# Patient Record
Sex: Female | Born: 1970 | Race: White | Hispanic: No | Marital: Married | Smoking: Former smoker
Health system: Southern US, Community
[De-identification: ages and names within clinical notes are randomized; demographics above are authoritative.]

## PROBLEM LIST (undated history)

## (undated) DIAGNOSIS — I82409 Acute embolism and thrombosis of unspecified deep veins of unspecified lower extremity: Secondary | ICD-10-CM

## (undated) DIAGNOSIS — R87619 Unspecified abnormal cytological findings in specimens from cervix uteri: Secondary | ICD-10-CM

## (undated) DIAGNOSIS — R0602 Shortness of breath: Secondary | ICD-10-CM

## (undated) DIAGNOSIS — E785 Hyperlipidemia, unspecified: Secondary | ICD-10-CM

## (undated) DIAGNOSIS — R000 Tachycardia, unspecified: Secondary | ICD-10-CM

## (undated) HISTORY — PX: CERVICAL BIOPSY  W/ LOOP ELECTRODE EXCISION: SUR135

## (undated) HISTORY — DX: Unspecified abnormal cytological findings in specimens from cervix uteri: R87.619

## (undated) HISTORY — DX: Acute embolism and thrombosis of unspecified deep veins of unspecified lower extremity: I82.409

## (undated) HISTORY — PX: DILATION AND CURETTAGE OF UTERUS: SHX78

## (undated) HISTORY — PX: COLPOSCOPY: SHX161

---

## 2005-12-26 HISTORY — PX: KNEE SURGERY: SHX244

## 2007-12-27 HISTORY — PX: BREAST BIOPSY: SHX20

## 2011-07-26 ENCOUNTER — Ambulatory Visit: Payer: Self-pay | Admitting: Unknown Physician Specialty

## 2012-07-30 ENCOUNTER — Ambulatory Visit: Payer: Self-pay | Admitting: Unknown Physician Specialty

## 2013-08-02 ENCOUNTER — Ambulatory Visit: Payer: Self-pay | Admitting: Unknown Physician Specialty

## 2014-05-29 ENCOUNTER — Ambulatory Visit (INDEPENDENT_AMBULATORY_CARE_PROVIDER_SITE_OTHER): Payer: Managed Care, Other (non HMO) | Admitting: General Surgery

## 2014-05-29 ENCOUNTER — Encounter: Payer: Self-pay | Admitting: General Surgery

## 2014-05-29 VITALS — BP 112/68 | HR 72 | Resp 12 | Ht 71.0 in | Wt 159.0 lb

## 2014-05-29 DIAGNOSIS — K625 Hemorrhage of anus and rectum: Secondary | ICD-10-CM

## 2014-05-29 LAB — POC HEMOCCULT BLD/STL (OFFICE/1-CARD/DIAGNOSTIC): Fecal Occult Blood, POC: NEGATIVE

## 2014-05-29 NOTE — Progress Notes (Signed)
Patient ID: Cheyenne Rodriguez, female   DOB: [birth year], 43 y.o.   MRN: [id number]  Chief Complaint  Patient presents with  . Other    hemorrhoids    HPI Cheyenne Rodriguez is a 43 y.o. female here today for an evaluation of hemorrhoids. Patient states they have been there for about 2 months. Her symptoms were severe for about a month, and that she does gradual improvement. Patient states they have not been bleeding in over a month. She states there has been sharp, stabbing and burning pain with the present flare up. She states she she moves her bowels once a day. . The patient was last seen here in 2010 regarding a breast tissue. This has not recurred. HPI  No past medical history on file.  Past Surgical History  Procedure Laterality Date  . Knee surgery  2007    Family History  Problem Relation Age of Onset  . Colon polyps Father 79  . Ovarian cancer Paternal Grandmother     Social History History  Substance Use Topics  . Smoking status: Former Smoker -- 1.00 packs/day for 2 years    Types: Cigarettes  . Smokeless tobacco: Never Used  . Alcohol Use: Yes    No Known Allergies  Current Outpatient Prescriptions  Medication Sig Dispense Refill  . [facility] 1/35 1-35 MG-MCG tablet Take 1 tablet by mouth daily.       No current facility-administered medications for this visit.    Review of Systems Review of Systems  Constitutional: Negative.   Respiratory: Negative.   Cardiovascular: Negative.   Gastrointestinal: Positive for anal bleeding.    Blood pressure 112/68, pulse 72, resp. rate 12, height 5\' 11"  (1.803 m), weight 159 lb (72.122 kg), last menstrual period 04/02/2014.  Physical Exam Physical Exam  Constitutional: She is oriented to person, place, and time. She appears well-developed and well-nourished.  Eyes: Conjunctivae are normal. No scleral icterus.  Neck: Neck supple.  Cardiovascular: Normal rate, regular rhythm and normal heart sounds.   Pulmonary/Chest: Breath  sounds normal.  Abdominal: Bowel sounds are normal.  Neurological: She is alert and oriented to person, place, and time.  Skin: Skin is warm and dry.    Data Reviewed Anoscopy was completed showing normal visualize lower rectal mucosa. Mild prominence of the internal hemorrhoid area without active bleeding or any lesions amenable to banding. No prolapsing mucosa with straining. Normal sphincter tone.  Assessment    Rectal bleeding likely from ano- rectal source, resolved.     Plan    At this time her family history of colon polyps in her father his 82s does not warrant early colonoscopy. She has been encouraged to continue the use of Citrucel, 1 tablespoon daily which he had initiated on her own over a month ago with good results. Should she have recurrent bleeding she was encouraged to call promptly for reassessment.     PCP: Eliberto Ivory Christyne Mccain 05/30/2014, 9:05 PM

## 2014-05-29 NOTE — Patient Instructions (Signed)
Patient to return as needed. 

## 2014-05-30 DIAGNOSIS — K625 Hemorrhage of anus and rectum: Secondary | ICD-10-CM | POA: Insufficient documentation

## 2014-08-14 ENCOUNTER — Ambulatory Visit: Payer: Self-pay | Admitting: Unknown Physician Specialty

## 2014-10-27 ENCOUNTER — Encounter: Payer: Self-pay | Admitting: General Surgery

## 2015-11-19 ENCOUNTER — Encounter: Payer: Self-pay | Admitting: Emergency Medicine

## 2015-11-19 ENCOUNTER — Emergency Department: Payer: Managed Care, Other (non HMO)

## 2015-11-19 ENCOUNTER — Emergency Department
Admission: EM | Admit: 2015-11-19 | Discharge: 2015-11-19 | Disposition: A | Discharge status: Home / Self Care | Payer: Managed Care, Other (non HMO) | Attending: Emergency Medicine | Admitting: Emergency Medicine

## 2015-11-19 DIAGNOSIS — Y9389 Activity, other specified: Secondary | ICD-10-CM | POA: Diagnosis not present

## 2015-11-19 DIAGNOSIS — Z79899 Other long term (current) drug therapy: Secondary | ICD-10-CM | POA: Diagnosis not present

## 2015-11-19 DIAGNOSIS — Y92009 Unspecified place in unspecified non-institutional (private) residence as the place of occurrence of the external cause: Secondary | ICD-10-CM | POA: Insufficient documentation

## 2015-11-19 DIAGNOSIS — S62002A Unspecified fracture of navicular [scaphoid] bone of left wrist, initial encounter for closed fracture: Secondary | ICD-10-CM | POA: Diagnosis not present

## 2015-11-19 DIAGNOSIS — X58XXXA Exposure to other specified factors, initial encounter: Secondary | ICD-10-CM | POA: Insufficient documentation

## 2015-11-19 DIAGNOSIS — M779 Enthesopathy, unspecified: Secondary | ICD-10-CM | POA: Insufficient documentation

## 2015-11-19 DIAGNOSIS — Z87891 Personal history of nicotine dependence: Secondary | ICD-10-CM | POA: Diagnosis not present

## 2015-11-19 DIAGNOSIS — Y998 Other external cause status: Secondary | ICD-10-CM | POA: Diagnosis not present

## 2015-11-19 DIAGNOSIS — M778 Other enthesopathies, not elsewhere classified: Secondary | ICD-10-CM

## 2015-11-19 DIAGNOSIS — S63502A Unspecified sprain of left wrist, initial encounter: Secondary | ICD-10-CM | POA: Diagnosis not present

## 2015-11-19 DIAGNOSIS — S6992XA Unspecified injury of left wrist, hand and finger(s), initial encounter: Secondary | ICD-10-CM | POA: Diagnosis present

## 2015-11-19 NOTE — ED Provider Notes (Signed)
[facility] Emergency Department Provider Note ____________________________________________  Time seen: 0825  I have reviewed the triage vital signs and the nursing notes.  HISTORY  Chief Complaint  Wrist Pain  HPI Cheyenne Rodriguez is a 44 y.o. female, right hand dominant, reports to the ED with left wrist pain following an an injury at home. She recalls that yesterday evening she pushed a large kitchen [place] across the floor, that did not have Castor's or rollers on it. She recalls is the only possible mechanism of injury for her wrist pain. She denies any other fall on outstretched hand, rash, or accident. She notes onset of pain about 3 AM this morning to the left wrist at the thumb. She denies any distal paresthesias, swelling, or grip changes. The pain is intermittently known how she ranges her hand or wrist. She rates her discomfort a 6/10 in triage.  History reviewed. No pertinent past medical history.  Patient Active Problem List   Diagnosis Date Noted  . Rectal bleeding 05/30/2014    Past Surgical History  Procedure Laterality Date  . Knee surgery  2007    Current Outpatient Rx  Name  Route  Sig  Dispense  Refill  . [facility]-MCG [facility]   Oral   Take 1 tablet by mouth daily.           Allergies Review of patient's allergies indicates no known allergies.  Family History  Problem Relation Age of Onset  . Colon polyps Father [id number]  . Ovarian cancer Paternal Grandmother     Social History Social History  Substance Use Topics  . Smoking status: Former Smoker -- 1.00 packs/day for 2 years    Types: Cigarettes  . Smokeless tobacco: Never Used  . Alcohol Use: Yes    Review of Systems  Constitutional: Negative for fever. Eyes: Negative for visual changes. ENT: Negative for sore throat. Cardiovascular: Negative for chest pain. Respiratory: Negative for shortness of breath. Gastrointestinal: Negative for abdominal pain, vomiting  and diarrhea. Genitourinary: Negative for dysuria. Musculoskeletal: Negative for back pain. Left wrist pain as above. Skin: Negative for rash. Neurological: Negative for headaches, focal weakness or numbness. ____________________________________________  PHYSICAL EXAM:  VITAL SIGNS: ED Triage Vitals  Enc Vitals Group     BP 11/19/15 0756 120/81 mmHg     Pulse Rate 11/19/15 0756 82     Resp 11/19/15 0756 20     Temp 11/19/15 0756 98.4 F (36.9 C)     Temp Source 11/19/15 0756 Oral     SpO2 11/19/15 0756 100 %     Weight 11/19/15 0756 160 lb (72.576 kg)     Height 11/19/15 0756 5\' 11"  (1.803 m)     Head Cir --      Peak Flow --      Pain Score 11/19/15 0754 6     Pain Loc --      Pain Edu? --      Excl. in GC? --    Constitutional: Alert and oriented. Well appearing and in no distress. Head: Normocephalic and atraumatic.      Eyes: Conjunctivae are normal. PERRL. Normal extraocular movements      Ears: Canals clear. TMs intact bilaterally.   Nose: No congestion/rhinorrhea.   Mouth/Throat: Mucous membranes are moist.   Neck: Supple. No thyromegaly. Hematological/Lymphatic/Immunological: No cervical lymphadenopathy. Cardiovascular: Normal rate, regular rhythm.  Respiratory: Normal respiratory effort. No wheezes/rales/rhonchi. Gastrointestinal: Soft and nontender. No distention. Musculoskeletal: Left wrist and thumb without  obvious deformity. Patient with normal composite fist on exam. She is with normal wrist range of motion in all planes. She is minimally tender to palpation over the extensor tendons of the thumb. She does have a positive snuffbox finding. She has a mildly positive [place]. Nontender with normal range of motion in all extremities.  Neurologic: Cranial nerves II through XII grossly intact. Normal intrinsic and opposition testing. Normal gait without ataxia. Normal speech and language. No gross focal neurologic deficits are appreciated. Skin:  Skin is  warm, dry and intact. No rash noted. Psychiatric: Mood and affect are normal. Patient exhibits appropriate insight and judgment. ____________________________________________   RADIOLOGY  Left Wrist/Thumb IMPRESSION: No acute abnormality noted  I, Weston Fulco, Charlesetta Ivory, personally viewed and evaluated these images (plain radiographs) as part of my medical decision making.  ____________________________________________  SPLINT APPLICATION Date/Time: 4098 Authorized by: Lissa Hoard Consent: Verbal consent obtained. Risks and benefits: risks, benefits and alternatives were discussed Consent given by: patient Splint applied by: me Location details: left thumb Splint type: Thumb spica  Supplies used: [facility] Post-procedure: The splinted body part was neurovascularly unchanged following the procedure. Patient tolerance: Patient tolerated the procedure well with no immediate complications. ____________________________________________  INITIAL IMPRESSION / ASSESSMENT AND PLAN / ED COURSE  Patient with an acute left thumb tenosynovitis and wrist sprain. She is tender in the snuffbox, so she will be managed appropriately. The mechanism is low-impact, so suspicion of true fracture is low. She was fitted with a thumb spica splint and encouraged to follow up with orthopedics if symptoms persist. She is given RICE instructions for management of her wrist and thumb sprain. ____________________________________________  FINAL CLINICAL IMPRESSION(S) / ED DIAGNOSES  Final diagnoses:  Wrist sprain, left, initial encounter  Tendinitis of thumb  Occult fracture of scaphoid bone of left wrist, closed, initial encounter      Lissa Hoard, PA-C 11/19/15 0944  Lissa Hoard, PA-C 11/19/15 1038  Sharman Cheek, MD 11/19/15 1353

## 2015-11-19 NOTE — Discharge Instructions (Signed)
Wrist Sprain A wrist sprain is a stretch or tear in the strong, fibrous tissues (ligaments) that connect your wrist bones. The ligaments of your wrist may be easily sprained. There are three types of wrist sprains.  Grade 1. The ligament is not stretched or torn, but the sprain causes pain.  Grade 2. The ligament is stretched or partially torn. You may be able to move your wrist, but not very much.  Grade 3. The ligament or muscle completely tears. You may find it difficult or extremely painful to move your wrist even a little. CAUSES Often, wrist sprains are a result of a fall or an injury. The force of the impact causes the fibers of your ligament to stretch too much or tear. Common causes of wrist sprains include:  Overextending your wrist while catching a ball with your hands.  Repetitive or strenuous extension or bending of your wrist.  Landing on your hand during a fall. RISK FACTORS  Having previous wrist injuries.  Playing contact sports, such as boxing or wrestling.  Participating in activities in which falling is common.  Having poor wrist strength and flexibility. SIGNS AND SYMPTOMS  Wrist pain.  Wrist tenderness.  Inflammation or bruising of the wrist area.  Hearing a "pop" or feeling a tear at the time of the injury.  Decreased wrist movement due to pain, stiffness, or weakness. DIAGNOSIS Your health care provider will examine your wrist. In some cases, an X-ray will be taken to make sure you did not break any bones. If your health care provider thinks that you tore a ligament, he or she may order an MRI of your wrist. TREATMENT Treatment involves resting and icing your wrist. You may also need to take pain medicines to help lessen pain and inflammation. Your health care provider may recommend keeping your wrist still (immobilized) with a splint to help your sprain heal. When the splint is no longer necessary, you may need to perform strengthening and stretching  exercises. These exercises help you to regain strength and full range of motion in your wrist. Surgery is not usually needed for wrist sprains unless the ligament completely tears. HOME CARE INSTRUCTIONS  Rest your wrist. Do not do things that cause pain.  Wear your wrist splint as directed by your health care provider.  Take medicines only as directed by your health care provider.  To ease pain and swelling, apply ice to the injured area.  Put ice in a plastic bag.  Place a towel between your skin and the bag.  Leave the ice on for 20 minutes, 2-3 times a day. SEEK MEDICAL CARE IF:  Your pain, discomfort, or swelling gets worse even with treatment.  You feel sudden numbness in your hand.   This information is not intended to replace advice given to you by your health care provider. Make sure you discuss any questions you have with your health care provider.   Document Released: 08/15/2014 Document Reviewed: 08/15/2014 Elsevier Interactive Patient Education 2016 [employer]  Tendinitis and Tenosynovitis  Tendinitis is inflammation of the tendon. Tenosynovitis is inflammation of the lining around the tendon (tendon sheath). These painful conditions often occur at once. Tendons attach muscle to bone. To move a limb, force from the muscle moves through the tendon, to the bone. These conditions often cause increased pain when moving. Tendinitis may be caused by a small or partial tear in the tendon.  SYMPTOMS   Pain, tenderness, redness, bruising, or swelling at the injury.  Loss of normal joint movement.  Pain that gets worse with use of the muscle and joint attached to the tendon.  Weakness in the tendon, caused by calcium build up that may occur with tendinitis.  Commonly affected tendons:  Achilles tendon (calf of leg).  Rotator cuff (shoulder joint).  Patellar tendon (kneecap to shin).  Peroneal tendon (ankle).  Posterior tibial tendon (inner ankle).  Biceps  tendon (in front of shoulder). CAUSES   Sudden strain on a flexed muscle, muscle overuse, sudden increase or change in activity, vigorous activity.  Result of a direct hit (less common).  Poor muscle action (biomechanics). RISK INCREASES WITH:  Injury (trauma).  Too much exercise.  Sudden change in athletic activity.  Incorrect exercise form or technique.  Poor strength and flexibility.  Not warming-up properly before activity.  Returning to activity before healing is complete. PREVENTION   Warm-up and stretch properly before activity.  Maintain physical fitness:  Joint flexibility.  Muscle strength and endurance.  Fitness that increases heart rate.  Learn and use proper exercise techniques.  Use rehabilitation exercises to strengthen weak muscles and tendons.  Ice the tendon after activity, to reduce recurring inflammation.  Wear proper fitting protective equipment for specific tendons, when indicated. PROGNOSIS  When treated properly, can be cured in 6 to 8 weeks. Recovery may take longer, depending on degree of injury.  RELATED COMPLICATIONS   Re-injury or recurring symptoms.  Permanent weakness or joint stiffness, if injury is severe and recovery is not completed.  Delayed healing, if sports are started before healing is complete.  Tearing apart (rupture) of the inflamed tendon. Tendinitis means the tendon is injured and must recover. TREATMENT  Treatment first involves ice, medicine, and rest from aggravating activities. This reduces pain and inflammation. Modifying your activity may be considered to prevent recurring injury. A brace, elastic bandage wrap, splint, cast, or sling may be prescribed to protect the joint for a short period. After that period, strengthening and stretching exercise may help to regain strength and full range of motion. If the condition persists, despite non-surgical treatment, surgery may be recommended to remove the inflamed  tendon lining. Corticosteroid injections may be given to reduce inflammation. However, these injections may weaken the tendon and increase your risk for tendon rupture. MEDICATION   If pain medicine is needed, nonsteroidal anti-inflammatory medicines (aspirin and ibuprofen), or other minor pain relievers (acetaminophen), are often recommended.  Do not take pain medicine for 7 days before surgery.  Prescription pain relievers are usually prescribed only after surgery. Use only as directed and only as much as you need.  Ointments applied to the skin may be helpful.  Corticosteroid injections may be given to reduce inflammation. However, this may increase your risk of a tendon rupture. HEAT AND COLD  Cold treatment (icing) relieves pain and reduces inflammation. Cold treatment should be applied for 10 to 15 minutes every 2 to 3 hours, and immediately after activity that aggravates your symptoms. Use ice packs or an ice massage.  Heat treatment may be used before performing stretching and strengthening activities prescribed by your caregiver, physical therapist, or athletic trainer. Use a heat pack or a warm water soak. SEEK MEDICAL CARE IF:   Symptoms get worse or do not improve, despite treatment.  Pain becomes too much to tolerate.  You develop numbness or tingling.  Toes become cold, or toenails become blue, gray, or dark colored.  New, unexplained symptoms develop. (Drugs used in treatment may produce side effects.)  This information is not intended to replace advice given to you by your health care provider. Make sure you discuss any questions you have with your health care provider.   Document Released: 12/12/2005 Document Revised: 03/05/2012 Document Reviewed: 03/26/2009 Elsevier Interactive Patient Education 2016 [employer]  Scaphoid Fracture, Wrist A fracture is a break in the bone. The bone you have broken often does not show up as a fracture on x-ray until later on in  the healing phase. This bone is called the scaphoid bone. With this bone, your caregiver will often cast or splint your wrist as though it is fractured, even if a fracture is not seen on the x-ray. This is often done with wrist injuries in which there is tenderness at the base of the thumb. An x-ray at 1-3 weeks after your injury may confirm this fracture. A cast or splint is used to protect and keep your injured bone in good position for healing. The cast or splint will be on generally for about 6 to 16 weeks, depending on your health, age, the fracture location and how quickly you heal. Another name for the scaphoid bone is the navicular bone. HOME CARE INSTRUCTIONS   To lessen the swelling and pain, keep the injured part elevated above your heart while sitting or lying down.  Apply ice to the injury for 15-20 minutes, 03-04 times per day while awake, for 2 days. Put the ice in a plastic bag and place a thin towel between the bag of ice and your cast.  If you have a plaster or fiberglass cast or splint:  Do not try to scratch the skin under the cast using sharp or pointed objects.  Check the skin around the cast every day. You may put lotion on any red or sore areas.  Keep your cast or splint dry and clean.  If you have a plaster splint:  Wear the splint as directed.  You may loosen the elastic bandage around the splint if your fingers become numb, tingle, or turn cold or blue.  If you have been put in a removable splint, wear and use as directed.  Do not put pressure on any part of your cast or splint; it may deform or break. Rest your cast or splint only on a pillow the first 24 hours until it is fully hardened.  Your cast or splint can be protected during bathing with a plastic bag. Do not lower the cast or splint into water.  Only take over-the-counter or prescription medicines for pain, discomfort, or fever as directed by your caregiver.  If your caregiver has given you a follow  up appointment, it is very important to keep that appointment. Not keeping the appointment could result in chronic pain and decreased function. If there is any problem keeping the appointment, you must call back to this facility for assistance. SEEK IMMEDIATE MEDICAL CARE IF:   Your cast gets damaged, wet or breaks.  You have continued severe pain or more swelling than you did before the cast or splint was put on.  Your skin or nails below the injury turn blue or gray, or feel cold or numb.  You have tingling or burning pain in your fingers or increasing pain with movement of your fingers   This information is not intended to replace advice given to you by your health care provider. Make sure you discuss any questions you have with your health care provider.   Document Released: 12/02/2002 Document Revised: 03/05/2012  Document Reviewed: 06/24/2015 Elsevier Interactive Patient Education 2016 [employer]   Apply ice through the splint to reduce pain and swelling. Follow-up with ortho as discussed if symptoms persist.

## 2015-11-19 NOTE — ED Notes (Signed)
Pt states she woke up at 0300 in the am with left wrist pain, states last night she pushed her kitchen [place] and she thinks that is what injured wrist

## 2017-05-26 ENCOUNTER — Encounter: Payer: Self-pay | Admitting: Certified Nurse Midwife

## 2017-05-26 ENCOUNTER — Ambulatory Visit (INDEPENDENT_AMBULATORY_CARE_PROVIDER_SITE_OTHER): Payer: 59 | Admitting: Certified Nurse Midwife

## 2017-05-26 VITALS — BP 115/84 | HR 87 | Ht 71.0 in | Wt 155.8 lb

## 2017-05-26 DIAGNOSIS — Z1231 Encounter for screening mammogram for malignant neoplasm of breast: Secondary | ICD-10-CM | POA: Diagnosis not present

## 2017-05-26 DIAGNOSIS — Z01419 Encounter for gynecological examination (general) (routine) without abnormal findings: Secondary | ICD-10-CM

## 2017-05-26 MED ORDER — NORETHIN ACE-ETH ESTRAD-FE 1-20 MG-MCG PO TABS
1.0000 | ORAL_TABLET | Freq: Every day | ORAL | 0 refills | Status: DC
Start: 2017-05-26 — End: 2017-05-26

## 2017-05-26 MED ORDER — NORETHIN ACE-ETH ESTRAD-FE 1-20 MG-MCG PO TABS: 1.0000 | ORAL_TABLET | Freq: Every day | ORAL | 3 refills | Status: DC

## 2017-05-26 NOTE — Progress Notes (Signed)
GYNECOLOGY ANNUAL PREVENTATIVE CARE ENCOUNTER NOTE  Subjective:   Cheyenne Rodriguez is a 46 y.o. [phone] female here for a routine annual gynecologic exam.  Current complaints: none.   Denies abnormal vaginal bleeding, discharge, pelvic pain, problems with intercourse or other gynecologic concerns.    Gynecologic History No LMP recorded. Patient is not currently having periods (Reason: Oral contraceptives). Contraception: OCP (estrogen/progesterone). Junel 1/20 working well.  Last Pap: 2017. Results were: normal per pt . Has history of abnormal pap. Says she gets them annually  Last mammogram: 2015. Results were: has history abnormal/biopsy -WNL  Obstetric History OB History  Gravida Para Term Preterm AB Living  3 2 2   1 2   SAB TAB Ectopic Multiple Live Births  1       2    # Outcome Date GA Lbr Len/2nd Weight Sex Delivery Anes PTL Lv  3 Term 1997   9 lb 12.8 oz (4.445 kg) M Vag-Spont   LIV  2 SAB 1995          1 Term 1991   8 lb 14.4 oz (4.037 kg) F Vag-Spont   LIV    Obstetric Comments  1st Menstrual Cycle:  12  1st Pregnancy: 18    Past Medical History:  Diagnosis Date  . Abnormal Pap smear of cervix     Past Surgical History:  Procedure Laterality Date  . CERVICAL BIOPSY  W/ LOOP ELECTRODE EXCISION    . COLPOSCOPY    . DILATION AND CURETTAGE OF UTERUS    . KNEE SURGERY  2007    No current outpatient prescriptions on file prior to visit.   No current facility-administered medications on file prior to visit.     Allergies  Allergen Reactions  . Other Diarrhea    Z-Pak    Social History   Social History  . Marital status: Married    Spouse name: N/A  . Number of children: N/A  . Years of education: N/A   Occupational History  . Not on file.   Social History Main Topics  . Smoking status: Former Smoker    Packs/day: 1.00    Years: 2.00    Types: Cigarettes  . Smokeless tobacco: Never Used  . Alcohol use Yes     Comment: weekly  . Drug use: No   . Sexual activity: Yes    Birth control/ protection: Pill   Other Topics Concern  . Not on file   Social History Narrative  . No narrative on file  Exercises regularly 4-5 x a week.  Healthy diet   Family History  Problem Relation Age of Onset  . Colon polyps Father [id number]  . Colon cancer Father   . Ovarian cancer Paternal Grandmother   . Breast cancer Neg Hx   . Diabetes Neg Hx     The following portions of the patient's history were reviewed and updated as appropriate: allergies, current medications, past family history, past medical history, past social history, past surgical history and problem list.  Review of Systems Constitutional: negative Eyes: negative Ears, nose, mouth, throat, and face: negative Respiratory: negative Cardiovascular: negative except for pt states that she has episodes her heart is pumping harder, she can feel it pulsing. Has had it worked up by cardiology. Nothing was found. Continues to have "episodes" less frequent. She has gotten used to it.  Gastrointestinal: negative Genitourinary:negative Integument/breast: negative Hematologic/lymphatic: negative Musculoskeletal:negative Neurological: negative Behavioral/Psych: negative   Objective:  BP 115/84   Pulse  87   Ht 5\' 11"  (1.803 m)   Wt 155 lb 12.8 oz (70.7 kg)   BMI 21.73 kg/m  CONSTITUTIONAL: Well-developed, well-nourished female in no acute distress.  HENT:  Normocephalic, atraumatic, External right and left ear normal. Oropharynx is clear and moist EYES: Conjunctivae and EOM are normal.  No scleral icterus.  NECK: Normal range of motion, supple, no masses.   Thyroid slight enlarged. Denies weight loss/gain, hair loss, increase in heart rate or anxiety. Denies fatigue.   SKIN: Skin is warm and dry. No rash noted. Not diaphoretic. No erythema. No pallor. NEUROLOGIC: Alert and oriented to person, place, and time. Normal reflexes, muscle tone coordination. No cranial nerve deficit  noted. PSYCHIATRIC: Normal mood and affect. Normal behavior. Normal judgment and thought content. CARDIOVASCULAR: Normal heart rate noted, regular rhythm RESPIRATORY: Clear to auscultation bilaterally. Effort and breath sounds normal, no problems with respiration noted. BREASTS: Symmetric in size. No masses, skin changes, nipple drainage, or lymphadenopathy. ABDOMEN: Soft, normal bowel sounds, no distention noted.  No tenderness, rebound or guarding.  PELVIC: Normal appearing external genitalia; normal appearing vaginal mucosa. Cervix scar tissue present.  No abnormal discharge noted.  Pap smear obtained.  Normal uterine size, no other palpable masses, no uterine or adnexal tenderness. MUSCULOSKELETAL: Normal range of motion. No tenderness.  No cyanosis, clubbing, or edema.  2+ distal pulses.   Assessment:  Annual gynecologic examination with pap smear  Plan:  Will follow up results of pap smear and manage accordingly. Mammogram scheduled, TSH. Routine preventative health maintenance measures emphasized.Disscused genetic testing for breat cancer screening.  Please refer to After Visit Summary for other counseling recommendations.    Doreene Burke, CNM

## 2017-05-26 NOTE — Patient Instructions (Signed)
Preventive Care 40-64 Years, Female Preventive care refers to lifestyle choices and visits with your health care provider that can promote health and wellness. What does preventive care include?  A yearly physical exam. This is also called an annual well check.  Dental exams once or twice a year.  Routine eye exams. Ask your health care provider how often you should have your eyes checked.  Personal lifestyle choices, including: ? Daily care of your teeth and gums. ? Regular physical activity. ? Eating a healthy diet. ? Avoiding tobacco and drug use. ? Limiting alcohol use. ? Practicing safe sex. ? Taking low-dose aspirin daily starting at age 58. ? Taking vitamin and mineral supplements as recommended by your health care provider. What happens during an annual well check? The services and screenings done by your health care provider during your annual well check will depend on your age, overall health, lifestyle risk factors, and family history of disease. Counseling Your health care provider may ask you questions about your:  Alcohol use.  Tobacco use.  Drug use.  Emotional well-being.  Home and relationship well-being.  Sexual activity.  Eating habits.  Work and work Statistician.  Method of birth control.  Menstrual cycle.  Pregnancy history.  Screening You may have the following tests or measurements:  Height, weight, and BMI.  Blood pressure.  Lipid and cholesterol levels. These may be checked every 5 years, or more frequently if you are over 81 years old.  Skin check.  Lung cancer screening. You may have this screening every year starting at age 78 if you have a 30-pack-year history of smoking and currently smoke or have quit within the past 15 years.  Fecal occult blood test (FOBT) of the stool. You may have this test every year starting at age 65.  Flexible sigmoidoscopy or colonoscopy. You may have a sigmoidoscopy every 5 years or a colonoscopy  every 10 years starting at age 30.  Hepatitis C blood test.  Hepatitis B blood test.  Sexually transmitted disease (STD) testing.  Diabetes screening. This is done by checking your blood sugar (glucose) after you have not eaten for a while (fasting). You may have this done every 1-3 years.  Mammogram. This may be done every 1-2 years. Talk to your health care provider about when you should start having regular mammograms. This may depend on whether you have a family history of breast cancer.  BRCA-related cancer screening. This may be done if you have a family history of breast, ovarian, tubal, or peritoneal cancers.  Pelvic exam and Pap test. This may be done every 3 years starting at age 80. Starting at age 36, this may be done every 5 years if you have a Pap test in combination with an HPV test.  Bone density scan. This is done to screen for osteoporosis. You may have this scan if you are at high risk for osteoporosis.  Discuss your test results, treatment options, and if necessary, the need for more tests with your health care provider. Vaccines Your health care provider may recommend certain vaccines, such as:  Influenza vaccine. This is recommended every year.  Tetanus, diphtheria, and acellular pertussis (Tdap, Td) vaccine. You may need a Td booster every 10 years.  Varicella vaccine. You may need this if you have not been vaccinated.  Zoster vaccine. You may need this after age 5.  Measles, mumps, and rubella (MMR) vaccine. You may need at least one dose of MMR if you were born in  1957 or later. You may also need a second dose.  Pneumococcal 13-valent conjugate (PCV13) vaccine. You may need this if you have certain conditions and were not previously vaccinated.  Pneumococcal polysaccharide (PPSV23) vaccine. You may need one or two doses if you smoke cigarettes or if you have certain conditions.  Meningococcal vaccine. You may need this if you have certain  conditions.  Hepatitis A vaccine. You may need this if you have certain conditions or if you travel or work in places where you may be exposed to hepatitis A.  Hepatitis B vaccine. You may need this if you have certain conditions or if you travel or work in places where you may be exposed to hepatitis B.  Haemophilus influenzae type b (Hib) vaccine. You may need this if you have certain conditions.  Talk to your health care provider about which screenings and vaccines you need and how often you need them. This information is not intended to replace advice given to you by your health care provider. Make sure you discuss any questions you have with your health care provider. Document Released: 01/08/2016 Document Revised: 08/31/2016 Document Reviewed: 10/13/2015 Elsevier Interactive Patient Education  2017 [employer].

## 2017-05-27 LAB — TSH: TSH: 0.861 u[IU]/mL (ref 0.450–4.500)

## 2017-05-29 ENCOUNTER — Encounter: Payer: Self-pay | Admitting: Certified Nurse Midwife

## 2017-05-31 ENCOUNTER — Encounter: Payer: Self-pay | Admitting: Certified Nurse Midwife

## 2017-05-31 LAB — PAP IG AND HPV HIGH-RISK
HPV, high-risk: NEGATIVE
PAP Smear Comment: 0

## 2017-06-13 ENCOUNTER — Ambulatory Visit
Admission: RE | Admit: 2017-06-13 | Discharge: 2017-06-13 | Disposition: A | Discharge status: Home / Self Care | Payer: Managed Care, Other (non HMO) | Source: Ambulatory Visit | Attending: Certified Nurse Midwife | Admitting: Certified Nurse Midwife

## 2017-06-13 DIAGNOSIS — Z1231 Encounter for screening mammogram for malignant neoplasm of breast: Secondary | ICD-10-CM | POA: Insufficient documentation

## 2017-06-22 ENCOUNTER — Encounter: Payer: Self-pay | Admitting: Certified Nurse Midwife

## 2017-07-03 ENCOUNTER — Encounter: Payer: Self-pay | Admitting: Certified Nurse Midwife

## 2017-07-04 ENCOUNTER — Other Ambulatory Visit: Payer: Self-pay | Admitting: Certified Nurse Midwife

## 2017-07-04 MED ORDER — NORETHINDRONE ACET-ETHINYL EST 1.5-30 MG-MCG PO TABS: 1.0000 | ORAL_TABLET | Freq: Every day | ORAL | 11 refills | Status: DC

## 2017-07-04 NOTE — Progress Notes (Signed)
Pt requested Junel 21. Order placed.   Doreene BurkeAnnie Zadaya Cuadra, CNM

## 2018-01-08 ENCOUNTER — Other Ambulatory Visit: Payer: Self-pay | Admitting: Family Medicine

## 2018-01-08 DIAGNOSIS — Z1231 Encounter for screening mammogram for malignant neoplasm of breast: Secondary | ICD-10-CM

## 2018-06-18 ENCOUNTER — Ambulatory Visit
Admission: RE | Admit: 2018-06-18 | Discharge: 2018-06-18 | Disposition: A | Discharge status: Home / Self Care | Payer: 59 | Source: Ambulatory Visit | Attending: Family Medicine | Admitting: Family Medicine

## 2018-06-18 DIAGNOSIS — Z1231 Encounter for screening mammogram for malignant neoplasm of breast: Secondary | ICD-10-CM | POA: Diagnosis present

## 2018-06-20 ENCOUNTER — Other Ambulatory Visit: Payer: Self-pay | Admitting: Family Medicine

## 2018-06-20 DIAGNOSIS — R928 Other abnormal and inconclusive findings on diagnostic imaging of breast: Secondary | ICD-10-CM

## 2018-06-20 DIAGNOSIS — N6320 Unspecified lump in the left breast, unspecified quadrant: Secondary | ICD-10-CM

## 2018-07-03 ENCOUNTER — Ambulatory Visit
Admission: RE | Admit: 2018-07-03 | Discharge: 2018-07-03 | Disposition: A | Discharge status: Home / Self Care | Payer: 59 | Source: Ambulatory Visit | Attending: Family Medicine | Admitting: Family Medicine

## 2018-07-03 DIAGNOSIS — N6320 Unspecified lump in the left breast, unspecified quadrant: Secondary | ICD-10-CM

## 2018-07-03 DIAGNOSIS — R928 Other abnormal and inconclusive findings on diagnostic imaging of breast: Secondary | ICD-10-CM | POA: Diagnosis present

## 2018-08-02 ENCOUNTER — Telehealth: Payer: Self-pay | Admitting: Certified Nurse Midwife

## 2018-08-02 ENCOUNTER — Other Ambulatory Visit: Payer: Self-pay | Admitting: *Deleted

## 2018-08-02 MED ORDER — NORETHINDRONE ACET-ETHINYL EST 1.5-30 MG-MCG PO TABS: 1.0000 | ORAL_TABLET | Freq: Every day | ORAL | 0 refills | Status: DC

## 2018-08-02 NOTE — Telephone Encounter (Signed)
The patient called and stated that she needs a refill of her birth control she will be going out of town tomorrow and is hoping to get it before then, Her preferred pharmacy is [facility] [address]. The patient would also like a call back. Please advise.

## 2018-08-02 NOTE — Telephone Encounter (Signed)
Sent in 1 pack, notified pt

## 2018-08-08 ENCOUNTER — Other Ambulatory Visit (HOSPITAL_COMMUNITY)
Admission: RE | Admit: 2018-08-08 | Discharge: 2018-08-08 | Disposition: A | Discharge status: Home / Self Care | Payer: 59 | Source: Ambulatory Visit | Attending: Certified Nurse Midwife | Admitting: Certified Nurse Midwife

## 2018-08-08 ENCOUNTER — Encounter: Payer: Self-pay | Admitting: Certified Nurse Midwife

## 2018-08-08 ENCOUNTER — Ambulatory Visit (INDEPENDENT_AMBULATORY_CARE_PROVIDER_SITE_OTHER): Payer: 59 | Admitting: Certified Nurse Midwife

## 2018-08-08 VITALS — BP 117/82 | HR 78 | Ht 71.0 in | Wt 172.0 lb

## 2018-08-08 DIAGNOSIS — Z124 Encounter for screening for malignant neoplasm of cervix: Secondary | ICD-10-CM

## 2018-08-08 DIAGNOSIS — Z0000 Encounter for general adult medical examination without abnormal findings: Secondary | ICD-10-CM

## 2018-08-08 DIAGNOSIS — Z1231 Encounter for screening mammogram for malignant neoplasm of breast: Secondary | ICD-10-CM

## 2018-08-08 MED ORDER — NORETHINDRONE ACET-ETHINYL EST 1.5-30 MG-MCG PO TABS: 1.0000 | ORAL_TABLET | Freq: Every day | ORAL | 11 refills | Status: DC

## 2018-08-08 NOTE — Progress Notes (Addendum)
GYNECOLOGY ANNUAL PREVENTATIVE CARE ENCOUNTER NOTE  Subjective:   [name] is a 47 y.o. [phone] female here for a routine annual gynecologic exam.  Current complaints: vaginal dryness.  Denies abnormal vaginal bleeding, discharge, pelvic pain, problems with intercourse or other gynecologic concerns.    Gynecologic History No LMP recorded. (Menstrual status: Oral contraceptives). Contraception: OCP (estrogen/progesterone) Last Pap: 05/26/2017. Results were: normal with negative HPV Last mammogram: 07/03/16. Results were: left breast at 7 o'clock, 5 cm from the nipple demonstrates a circumscribed oval hypoechoic mass measuring 0.7 x 0.2 x 0.6 cm.    Obstetric History OB History  Gravida Para Term Preterm AB Living  3 2 2   1 2   SAB TAB Ectopic Multiple Live Births  1       2    # Outcome Date GA Lbr Len/2nd Weight Sex Delivery Anes PTL Lv  3 Term 1997   9 lb 12.8 oz (4.445 kg) M Vag-Spont   LIV  2 SAB 1995          1 Term 1991   8 lb 14.4 oz (4.037 kg) F Vag-Spont   LIV    Obstetric Comments  1st Menstrual Cycle:  12  1st Pregnancy: 18    Past Medical History:  Diagnosis Date  . Abnormal Pap smear of cervix     Past Surgical History:  Procedure Laterality Date  . BREAST BIOPSY Right 2009   neg  . CERVICAL BIOPSY  W/ LOOP ELECTRODE EXCISION    . COLPOSCOPY    . DILATION AND CURETTAGE OF UTERUS    . KNEE SURGERY  2007    Current Outpatient Medications on File Prior to Visit  Medication Sig Dispense Refill  . Norethindrone Acetate-Ethinyl Estradiol (JUNEL,LOESTRIN,MICROGESTIN) 1.5-30 MG-MCG tablet Take 1 tablet by mouth daily. 1 Package 0  . Multiple Vitamins-Minerals (MULTIVITAMIN ADULT PO) Take by mouth.     No current facility-administered medications on file prior to visit.     Allergies  Allergen Reactions  . Other Diarrhea    Z-Pak    Social History:  reports that she has quit smoking. Her smoking use included cigarettes. She has a 2.00 pack-year  smoking history. She has never used smokeless tobacco. She reports that she drinks alcohol. She reports that she does not use drugs.  Family History  Problem Relation Age of Onset  . Colon polyps Father [id number]  . Colon cancer Father   . Ovarian cancer Paternal Grandmother   . Breast cancer Neg Hx   . Diabetes Neg Hx     The following portions of the patient's history were reviewed and updated as appropriate: allergies, current medications, past family history, past medical history, past social history, past surgical history and problem list.  Review of Systems Pertinent items noted in HPI and remainder of comprehensive ROS otherwise negative.   Objective:  BP 117/82   Pulse 78   Ht 5\' 11"  (1.803 m)   Wt 172 lb (78 kg)   BMI 23.99 kg/m  CONSTITUTIONAL: Well-developed, well-nourished female in no acute distress.  HENT:  Normocephalic, atraumatic, External right and left ear normal. Oropharynx is clear and moist EYES: Conjunctivae and EOM are normal. Pupils are equal, round, and reactive to light. No scleral icterus.  NECK: Normal range of motion, supple, no masses.  Normal thyroid.  SKIN: Skin is warm and dry. No rash noted. Not diaphoretic. No erythema. No pallor. MUSCULOSKELETAL: Normal range of motion. No tenderness.  No cyanosis, clubbing, or  edema.  2+ distal pulses. NEUROLOGIC: Alert and oriented to person, place, and time. Normal ,muscle tone coordination. No cranial nerve deficit noted. PSYCHIATRIC: Normal mood and affect. Normal behavior. Normal judgment and thought content. CARDIOVASCULAR: Normal heart rate noted, regular rhythm RESPIRATORY: Clear to auscultation bilaterally. Effort and breath sounds normal, no problems with respiration noted. BREASTS: Symmetric in size. No masses, skin changes, nipple drainage, or lymphadenopathy. ABDOMEN: Soft, normal bowel sounds, no distention noted.  No tenderness, rebound or guarding.  PELVIC: Normal appearing external genitalia; normal  appearing vaginal mucosa and cervix.  No abnormal discharge noted. Scar tissue/pale color of cervix. Hx of colposcopy.  Pap smear obtained.  Normal uterine size, no other palpable masses, no uterine or adnexal tenderness.    Assessment and Plan:  Gynecological Annual exam.  Vaginal dryness-OTC water based lubricant Pap smear not indicated  Continue OCP-Prescription for Junel sent to pharmacy on file Mammogram scheduled-4712month follow up-benign mass on left breast Labs-not indicated   Lipid Profile and Glucose-Done with PCP  Family history of cancer-Pamphlet given for genetic testing Routine preventative health maintenance measures emphasized including   perimenopausal/menopausal symptoms. Discussed transitioning off or BC. She would like to continue OCP at this time. Information on menopause given.  Please refer to After Visit Summary for other counseling recommendations.  Follow up 1 year for annual exam or sooner if needed.   Shanika Creacy,SNM/Oriana Horiuchi,CNM

## 2018-08-08 NOTE — Progress Notes (Signed)
Pt is here for an annual exam. LPS 05/26/17. Pt would like to have one done.

## 2018-08-08 NOTE — Patient Instructions (Signed)
Preventive Care 40-64 Years, Female Preventive care refers to lifestyle choices and visits with your health care provider that can promote health and wellness. What does preventive care include?  A yearly physical exam. This is also called an annual well check.  Dental exams once or twice a year.  Routine eye exams. Ask your health care provider how often you should have your eyes checked.  Personal lifestyle choices, including: ? Daily care of your teeth and gums. ? Regular physical activity. ? Eating a healthy diet. ? Avoiding tobacco and drug use. ? Limiting alcohol use. ? Practicing safe sex. ? Taking low-dose aspirin daily starting at age 58. ? Taking vitamin and mineral supplements as recommended by your health care provider. What happens during an annual well check? The services and screenings done by your health care provider during your annual well check will depend on your age, overall health, lifestyle risk factors, and family history of disease. Counseling Your health care provider may ask you questions about your:  Alcohol use.  Tobacco use.  Drug use.  Emotional well-being.  Home and relationship well-being.  Sexual activity.  Eating habits.  Work and work Statistician.  Method of birth control.  Menstrual cycle.  Pregnancy history.  Screening You may have the following tests or measurements:  Height, weight, and BMI.  Blood pressure.  Lipid and cholesterol levels. These may be checked every 5 years, or more frequently if you are over 81 years old.  Skin check.  Lung cancer screening. You may have this screening every year starting at age 78 if you have a 30-pack-year history of smoking and currently smoke or have quit within the past 15 years.  Fecal occult blood test (FOBT) of the stool. You may have this test every year starting at age 65.  Flexible sigmoidoscopy or colonoscopy. You may have a sigmoidoscopy every 5 years or a colonoscopy  every 10 years starting at age 30.  Hepatitis C blood test.  Hepatitis B blood test.  Sexually transmitted disease (STD) testing.  Diabetes screening. This is done by checking your blood sugar (glucose) after you have not eaten for a while (fasting). You may have this done every 1-3 years.  Mammogram. This may be done every 1-2 years. Talk to your health care provider about when you should start having regular mammograms. This may depend on whether you have a family history of breast cancer.  BRCA-related cancer screening. This may be done if you have a family history of breast, ovarian, tubal, or peritoneal cancers.  Pelvic exam and Pap test. This may be done every 3 years starting at age 80. Starting at age 36, this may be done every 5 years if you have a Pap test in combination with an HPV test.  Bone density scan. This is done to screen for osteoporosis. You may have this scan if you are at high risk for osteoporosis.  Discuss your test results, treatment options, and if necessary, the need for more tests with your health care provider. Vaccines Your health care provider may recommend certain vaccines, such as:  Influenza vaccine. This is recommended every year.  Tetanus, diphtheria, and acellular pertussis (Tdap, Td) vaccine. You may need a Td booster every 10 years.  Varicella vaccine. You may need this if you have not been vaccinated.  Zoster vaccine. You may need this after age 5.  Measles, mumps, and rubella (MMR) vaccine. You may need at least one dose of MMR if you were born in  1957 or later. You may also need a second dose.  Pneumococcal 13-valent conjugate (PCV13) vaccine. You may need this if you have certain conditions and were not previously vaccinated.  Pneumococcal polysaccharide (PPSV23) vaccine. You may need one or two doses if you smoke cigarettes or if you have certain conditions.  Meningococcal vaccine. You may need this if you have certain  conditions.  Hepatitis A vaccine. You may need this if you have certain conditions or if you travel or work in places where you may be exposed to hepatitis A.  Hepatitis B vaccine. You may need this if you have certain conditions or if you travel or work in places where you may be exposed to hepatitis B.  Haemophilus influenzae type b (Hib) vaccine. You may need this if you have certain conditions.  Talk to your health care provider about which screenings and vaccines you need and how often you need them. This information is not intended to replace advice given to you by your health care provider. Make sure you discuss any questions you have with your health care provider. Document Released: 01/08/2016 Document Revised: 08/31/2016 Document Reviewed: 10/13/2015 Elsevier Interactive Patient Education  2018 [employer]  

## 2018-08-09 LAB — CYTOLOGY - PAP: Diagnosis: NEGATIVE

## 2018-10-31 ENCOUNTER — Other Ambulatory Visit: Payer: Self-pay | Admitting: Family Medicine

## 2018-10-31 DIAGNOSIS — N6320 Unspecified lump in the left breast, unspecified quadrant: Secondary | ICD-10-CM

## 2019-01-07 ENCOUNTER — Ambulatory Visit
Admission: RE | Admit: 2019-01-07 | Discharge: 2019-01-07 | Disposition: A | Discharge status: Home / Self Care | Payer: 59 | Source: Ambulatory Visit | Attending: Family Medicine | Admitting: Family Medicine

## 2019-01-07 DIAGNOSIS — N6320 Unspecified lump in the left breast, unspecified quadrant: Secondary | ICD-10-CM

## 2019-01-09 ENCOUNTER — Other Ambulatory Visit: Payer: Self-pay | Admitting: Family Medicine

## 2019-01-09 DIAGNOSIS — N6320 Unspecified lump in the left breast, unspecified quadrant: Secondary | ICD-10-CM

## 2019-06-20 ENCOUNTER — Ambulatory Visit
Admission: RE | Admit: 2019-06-20 | Discharge: 2019-06-20 | Disposition: A | Discharge status: Home / Self Care | Payer: 59 | Source: Ambulatory Visit | Attending: Family Medicine | Admitting: Family Medicine

## 2019-06-20 ENCOUNTER — Other Ambulatory Visit: Payer: Self-pay

## 2019-06-20 DIAGNOSIS — N6320 Unspecified lump in the left breast, unspecified quadrant: Secondary | ICD-10-CM | POA: Insufficient documentation

## 2019-10-08 ENCOUNTER — Other Ambulatory Visit: Payer: Self-pay | Admitting: Certified Nurse Midwife

## 2019-10-14 ENCOUNTER — Other Ambulatory Visit: Payer: Self-pay

## 2019-10-14 ENCOUNTER — Ambulatory Visit (INDEPENDENT_AMBULATORY_CARE_PROVIDER_SITE_OTHER): Payer: 59 | Admitting: Certified Nurse Midwife

## 2019-10-14 ENCOUNTER — Other Ambulatory Visit (HOSPITAL_COMMUNITY)
Admission: RE | Admit: 2019-10-14 | Discharge: 2019-10-14 | Disposition: A | Discharge status: Home / Self Care | Payer: 59 | Source: Ambulatory Visit | Attending: Certified Nurse Midwife | Admitting: Certified Nurse Midwife

## 2019-10-14 ENCOUNTER — Encounter: Payer: Self-pay | Admitting: Certified Nurse Midwife

## 2019-10-14 VITALS — BP 118/88 | HR 68 | Ht 71.0 in | Wt 172.5 lb

## 2019-10-14 DIAGNOSIS — Z01419 Encounter for gynecological examination (general) (routine) without abnormal findings: Secondary | ICD-10-CM | POA: Diagnosis present

## 2019-10-14 DIAGNOSIS — Z124 Encounter for screening for malignant neoplasm of cervix: Secondary | ICD-10-CM | POA: Insufficient documentation

## 2019-10-14 MED ORDER — NORETHINDRONE ACET-ETHINYL EST 1.5-30 MG-MCG PO TABS: 1.0000 | ORAL_TABLET | Freq: Every day | ORAL | 11 refills | Status: DC

## 2019-10-14 NOTE — Progress Notes (Signed)
GYNECOLOGY ANNUAL PREVENTATIVE CARE ENCOUNTER NOTE  History:     Cheyenne Rodriguez is a 48 y.o. [id number] female here for a routine annual gynecologic exam.  Current complaints: none.   Denies abnormal vaginal bleeding, discharge, pelvic pain, problems with intercourse or other gynecologic concerns.      Married, no problems with intercourse Works from home  Gynecologic History Patient's last menstrual period was 09/05/2019 (approximate). Contraception: OCP (estrogen/progesterone) Last Pap: 08/08/18. Results were: normal  Last mammogram: 06/20/19. Results were:Stable probably benign mass in the 7 o'clock location of the LEFT breast. No mammographic or ultrasound evidence for malignancy.   Obstetric History OB History  Gravida Para Term Preterm AB Living  3 2 2   1 2   SAB TAB Ectopic Multiple Live Births  1       2    # Outcome Date GA Lbr Len/2nd Weight Sex Delivery Anes PTL Lv  3 Term 1997   9 lb 12.8 oz (4.445 kg) M Vag-Spont   LIV  2 SAB 1995          1 Term 1991   8 lb 14.4 oz (4.037 kg) F Vag-Spont   LIV    Obstetric Comments  1st Menstrual Cycle:  12  1st Pregnancy: 18    Past Medical History:  Diagnosis Date  . Abnormal Pap smear of cervix     Past Surgical History:  Procedure Laterality Date  . BREAST BIOPSY Right 2009   neg  . CERVICAL BIOPSY  W/ LOOP ELECTRODE EXCISION    . COLPOSCOPY    . DILATION AND CURETTAGE OF UTERUS    . KNEE SURGERY  2007    Current Outpatient Medications on File Prior to Visit  Medication Sig Dispense Refill  . JUNEL 1.5/30 1.5-30 MG-MCG tablet TAKE 1 TABLET BY MOUTH EVERY DAY 1 Package 0  . Multiple Vitamins-Minerals (MULTIVITAMIN ADULT PO) Take by mouth.     No current facility-administered medications on file prior to visit.     Allergies  Allergen Reactions  . Other Diarrhea    Z-Pak    Social History:  reports that she has quit smoking. Her smoking use included cigarettes. She has a 2.00 pack-year smoking history.  She has never used smokeless tobacco. She reports current alcohol use. She reports that she does not use drugs.  Exercises regularly, denies smoking, drinking and drugs.  Family History  Problem Relation Age of Onset  . Colon polyps Father 53  . Colon cancer Father   . Ovarian cancer Paternal Grandmother   . Breast cancer Neg Hx   . Diabetes Neg Hx     The following portions of the patient's history were reviewed and updated as appropriate: allergies, current medications, past family history, past medical history, past social history, past surgical history and problem list.  Review of Systems Pertinent items noted in HPI and remainder of comprehensive ROS otherwise negative.  Physical Exam:  BP 118/88   Pulse 68   Ht 5\' 11"  (1.803 m)   Wt 172 lb 8 oz (78.2 kg)   LMP 09/05/2019 (Approximate)   BMI 24.06 kg/m  CONSTITUTIONAL: Well-developed, well-nourished female in no acute distress.  HENT:  Normocephalic, atraumatic, External right and left ear normal. Oropharynx is clear and moist EYES: Conjunctivae and EOM are normal. Pupils are equal, round, and reactive to light. No scleral icterus.  NECK: Normal range of motion, supple, no masses.  Normal thyroid.  SKIN: Skin is warm and dry. No rash  noted. Not diaphoretic. No erythema. No pallor. MUSCULOSKELETAL: Normal range of motion. No tenderness.  No cyanosis, clubbing, or edema.  2+ distal pulses. NEUROLOGIC: Alert and oriented to person, place, and time. Normal reflexes, muscle tone coordination. No cranial nerve deficit noted. PSYCHIATRIC: Normal mood and affect. Normal behavior. Normal judgment and thought content. CARDIOVASCULAR: Normal heart rate noted, regular rhythm RESPIRATORY: Clear to auscultation bilaterally. Effort and breath sounds normal, no problems with respiration noted. BREASTS: Symmetric in size. No masses, skin changes, nipple drainage, or lymphadenopathy. ABDOMEN: Soft, normal bowel sounds, no distention noted.  No  tenderness, rebound or guarding.  PELVIC: Normal appearing external genitalia; normal appearing vaginal mucosa and cervix.  No abnormal discharge noted.  Pap smear obtained.  Normal uterine size, no other palpable masses, no uterine or adnexal tenderness.   Assessment and Plan:  Annual Well Women GYN Exam  Will follow up results of pap smear and manage accordingly. Mammogram done 05/2019 Labs: done at PCP Refill: on [facility]  Routine preventative health maintenance measures emphasized. Please refer to After Visit Summary for other counseling recommendations.      Doreene Burke, CNM

## 2019-10-14 NOTE — Patient Instructions (Signed)
Preventive Care 3-49 Years Old, Female Preventive care refers to visits with your health care provider and lifestyle choices that can promote health and wellness. This includes:  A yearly physical exam. This may also be called an annual well check.  Regular dental visits and eye exams.  Immunizations.  Screening for certain conditions.  Healthy lifestyle choices, such as eating a healthy diet, getting regular exercise, not using drugs or products that contain nicotine and tobacco, and limiting alcohol use. What can I expect for my preventive care visit? Physical exam Your health care provider will check your:  Height and weight. This may be used to calculate body mass index (BMI), which tells if you are at a healthy weight.  Heart rate and blood pressure.  Skin for abnormal spots. Counseling Your health care provider may ask you questions about your:  Alcohol, tobacco, and drug use.  Emotional well-being.  Home and relationship well-being.  Sexual activity.  Eating habits.  Work and work Statistician.  Method of birth control.  Menstrual cycle.  Pregnancy history. What immunizations do I need?  Influenza (flu) vaccine  This is recommended every year. Tetanus, diphtheria, and pertussis (Tdap) vaccine  You may need a Td booster every 10 years. Varicella (chickenpox) vaccine  You may need this if you have not been vaccinated. Zoster (shingles) vaccine  You may need this after age 42. Measles, mumps, and rubella (MMR) vaccine  You may need at least one dose of MMR if you were born in 1957 or later. You may also need a second dose. Pneumococcal conjugate (PCV13) vaccine  You may need this if you have certain conditions and were not previously vaccinated. Pneumococcal polysaccharide (PPSV23) vaccine  You may need one or two doses if you smoke cigarettes or if you have certain conditions. Meningococcal conjugate (MenACWY) vaccine  You may need this if you  have certain conditions. Hepatitis A vaccine  You may need this if you have certain conditions or if you travel or work in places where you may be exposed to hepatitis A. Hepatitis B vaccine  You may need this if you have certain conditions or if you travel or work in places where you may be exposed to hepatitis B. Haemophilus influenzae type b (Hib) vaccine  You may need this if you have certain conditions. Human papillomavirus (HPV) vaccine  If recommended by your health care provider, you may need three doses over 6 months. You may receive vaccines as individual doses or as more than one vaccine together in one shot (combination vaccines). Talk with your health care provider about the risks and benefits of combination vaccines. What tests do I need? Blood tests  Lipid and cholesterol levels. These may be checked every 5 years, or more frequently if you are over 58 years old.  Hepatitis C test.  Hepatitis B test. Screening  Lung cancer screening. You may have this screening every year starting at age 33 if you have a 30-pack-year history of smoking and currently smoke or have quit within the past 15 years.  Colorectal cancer screening. All adults should have this screening starting at age 52 and continuing until age 93. Your health care provider may recommend screening at age 33 if you are at increased risk. You will have tests every 1-10 years, depending on your results and the type of screening test.  Diabetes screening. This is done by checking your blood sugar (glucose) after you have not eaten for a while (fasting). You may have this  done every 1-3 years.  Mammogram. This may be done every 1-2 years. Talk with your health care provider about when you should start having regular mammograms. This may depend on whether you have a family history of breast cancer.  BRCA-related cancer screening. This may be done if you have a family history of breast, ovarian, tubal, or peritoneal  cancers.  Pelvic exam and Pap test. This may be done every 3 years starting at age 21. Starting at age 30, this may be done every 5 years if you have a Pap test in combination with an HPV test. Other tests  Sexually transmitted disease (STD) testing.  Bone density scan. This is done to screen for osteoporosis. You may have this scan if you are at high risk for osteoporosis. Follow these instructions at home: Eating and drinking  Eat a diet that includes fresh fruits and vegetables, whole grains, lean protein, and low-fat dairy.  Take vitamin and mineral supplements as recommended by your health care provider.  Do not drink alcohol if: ? Your health care provider tells you not to drink. ? You are pregnant, may be pregnant, or are planning to become pregnant.  If you drink alcohol: ? Limit how much you have to 0-1 drink a day. ? Be aware of how much alcohol is in your drink. In the [place] one drink equals one 12 oz bottle of beer (355 mL), one 5 oz glass of wine (148 mL), or one 1 oz glass of hard liquor (44 mL). Lifestyle  Take daily care of your teeth and gums.  Stay active. Exercise for at least 30 minutes on 5 or more days each week.  Do not use any products that contain nicotine or tobacco, such as cigarettes, e-cigarettes, and chewing tobacco. If you need help quitting, ask your health care provider.  If you are sexually active, practice safe sex. Use a condom or other form of birth control (contraception) in order to prevent pregnancy and STIs (sexually transmitted infections).  If told by your health care provider, take low-dose aspirin daily starting at age 50. What's next?  Visit your health care provider once a year for a well check visit.  Ask your health care provider how often you should have your eyes and teeth checked.  Stay up to date on all vaccines. This information is not intended to replace advice given to you by your health care provider. Make sure you  discuss any questions you have with your health care provider. Document Released: 01/08/2016 Document Revised: 08/23/2018 Document Reviewed: 08/23/2018 Elsevier Patient Education  2020 Elsevier Inc.  

## 2019-10-17 LAB — CYTOLOGY - PAP
Comment: NEGATIVE
Diagnosis: NEGATIVE
High risk HPV: NEGATIVE

## 2020-03-23 IMAGING — MG MM DIGITAL DIAGNOSTIC UNILAT*L* W/ TOMO W/ CAD
4 series · 4 of 12 positions shown · non-contrast
Comparison: Previous exam(s).

CLINICAL DATA: Screening recall for a possible left breast mass.

EXAM:
DIGITAL DIAGNOSTIC LEFT MAMMOGRAM WITH CAD AND TOMO
ULTRASOUND LEFT BREAST

[L MLO synth-2D]
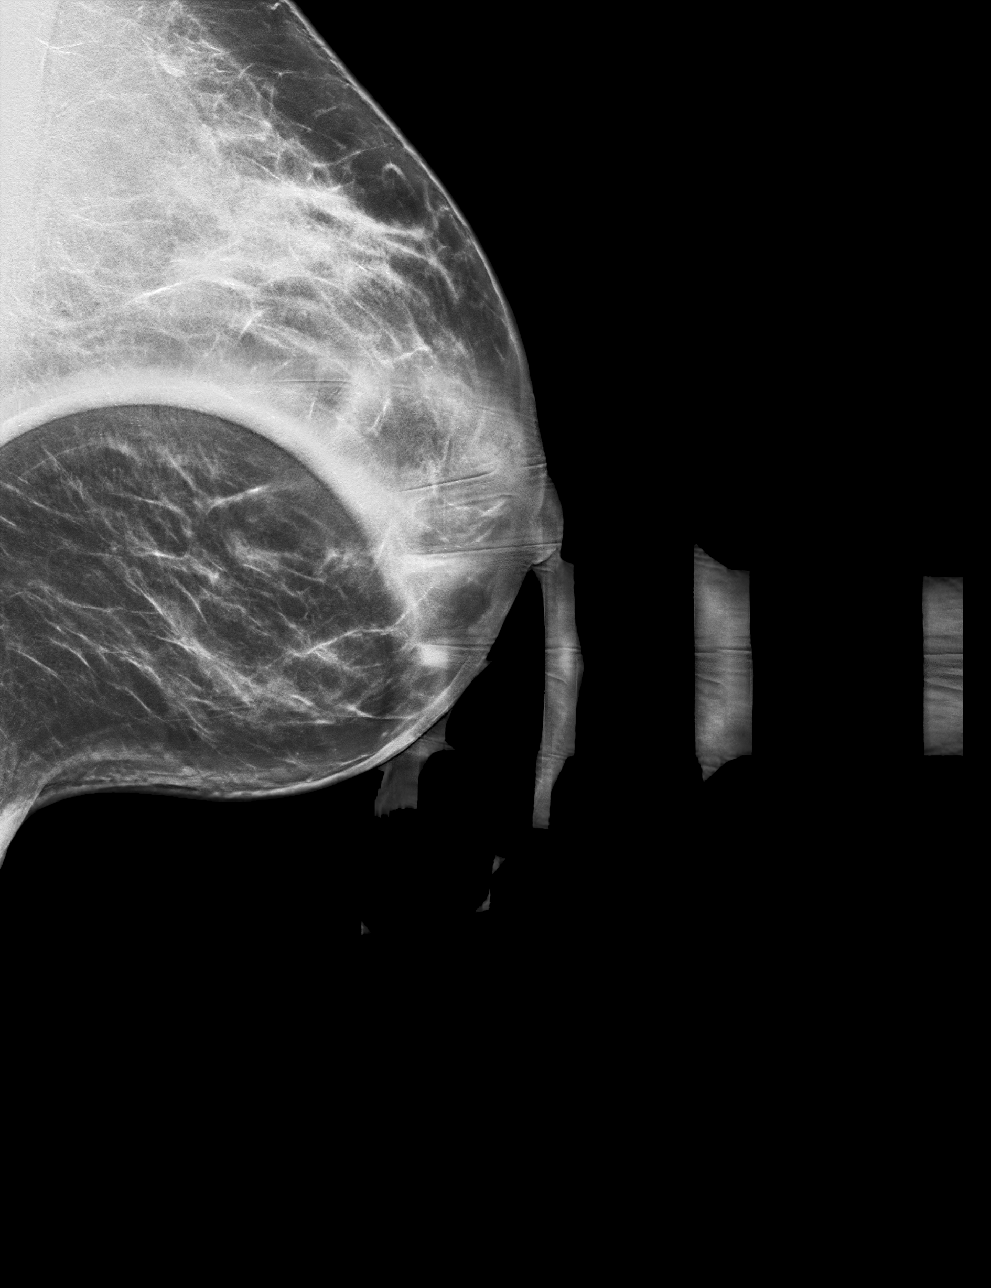

[L CC synth-2D]
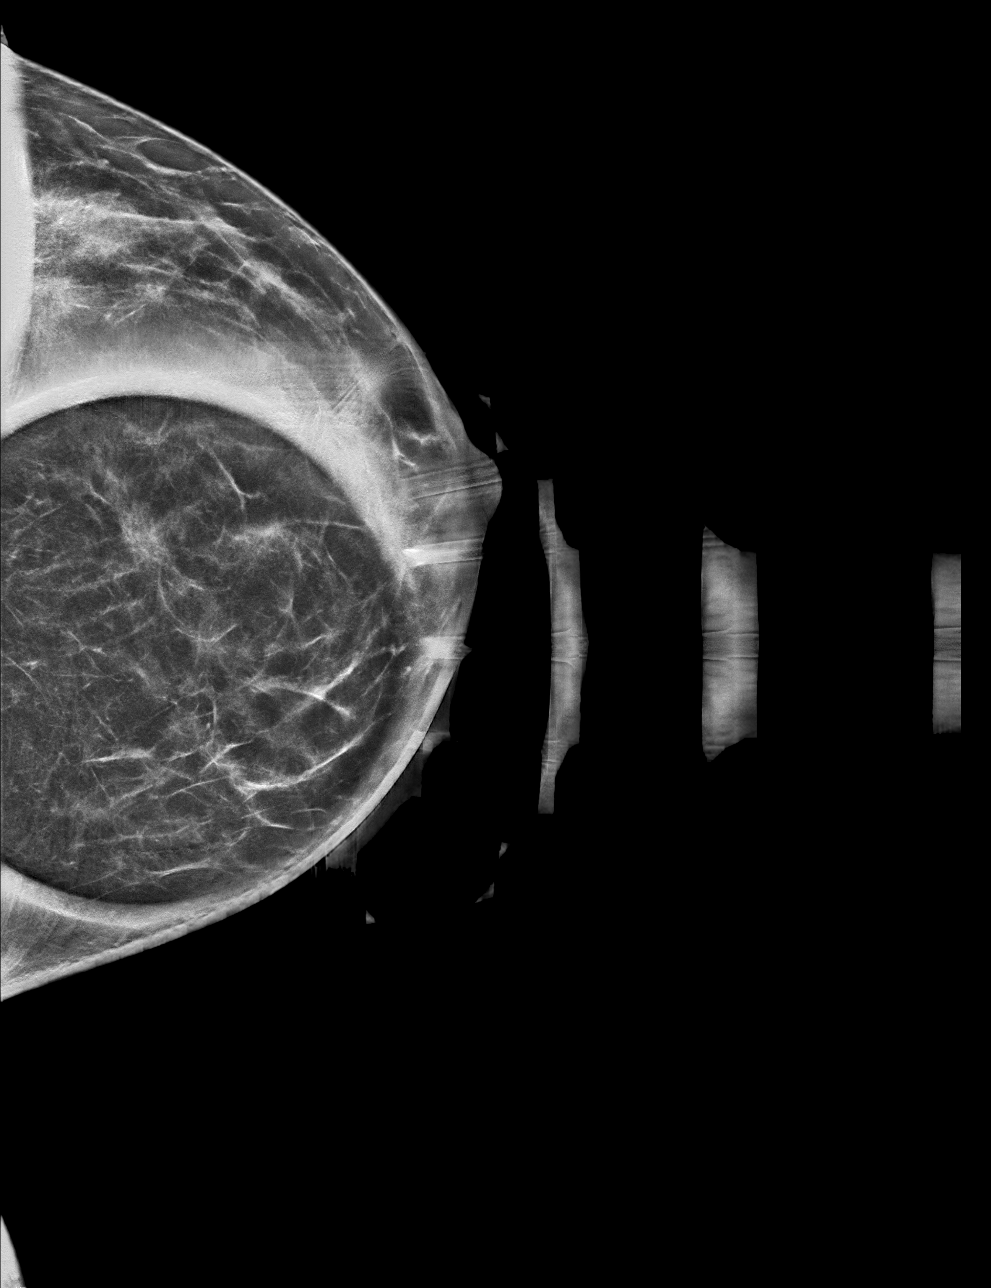

[L CC tomo · tomo slice 33/64.0]
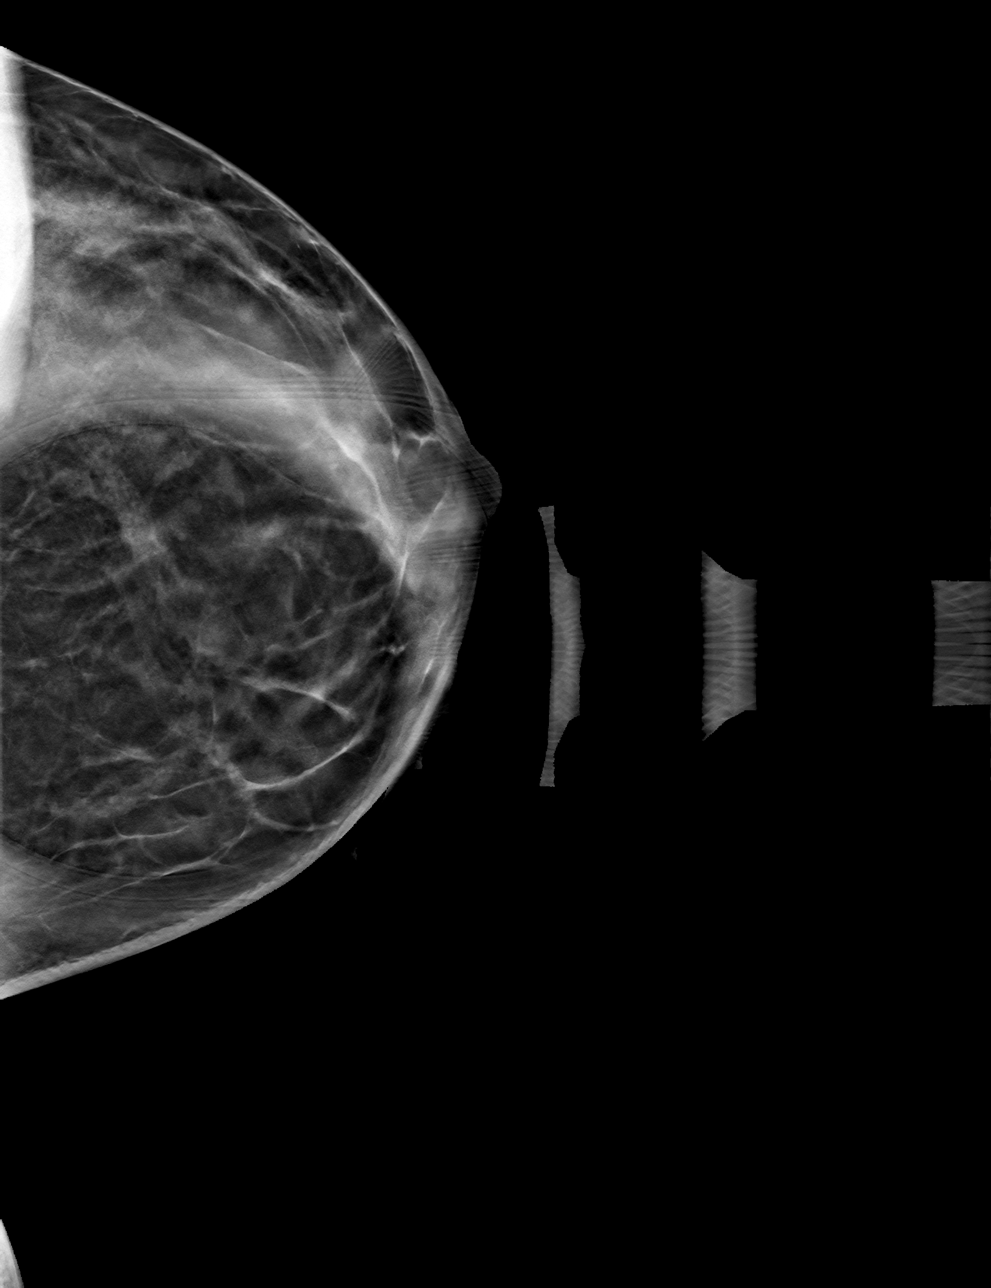

[L MLO tomo · tomo slice 31/60.0]
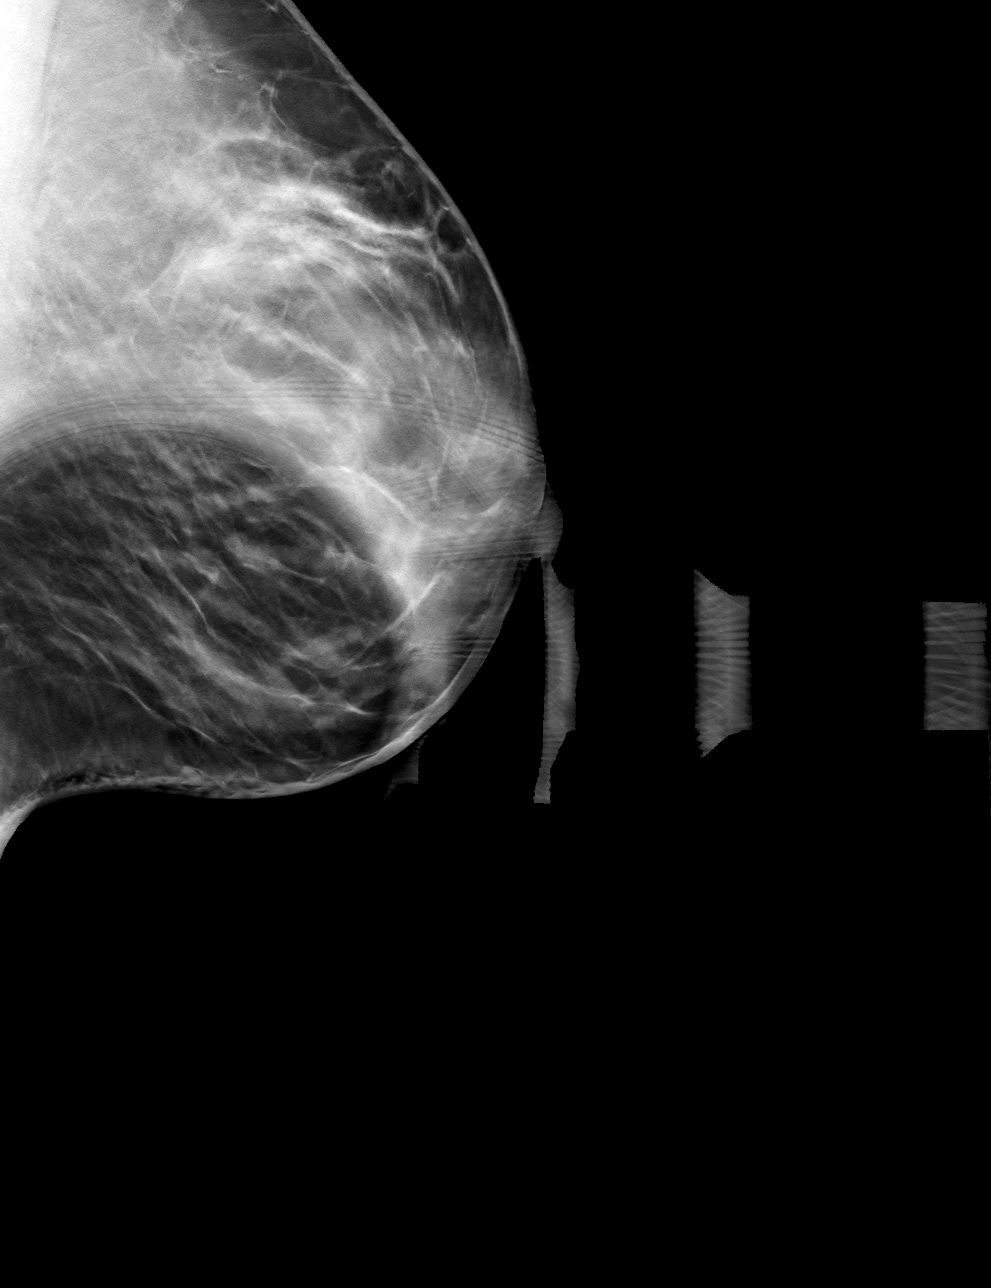

[4 of 12 positions shown; findings below may reference images not displayed]

ACR Breast Density Category c: The breast tissue is heterogeneously
dense, which may obscure small masses.
FINDINGS: There is a persistent obscured mass in the lower-inner quadrant
middle to posterior depth of the left breast. This measures
approximately 8 mm.

Mammographic images were processed with CAD.

On physical exam, no suspicious palpable masses are identified in
the lower-inner left breast.

Ultrasound targeted to the left breast at 7 o'clock, 5 cm from the
nipple demonstrates a circumscribed oval hypoechoic mass measuring
0.7 x 0.2 x 0.6 cm.
IMPRESSION: 1. There is a probably benign mass in the lower-inner quadrant of
the left breast.

RECOMMENDATION:
Six-month follow-up diagnostic left breast mammogram and ultrasound
is recommended.

I have discussed the findings and recommendations with the patient.
Results were also provided in writing at the conclusion of the
visit. If applicable, a reminder letter will be sent to the patient
regarding the next appointment.

BI-RADS CATEGORY  3: Probably benign.

## 2020-04-06 ENCOUNTER — Other Ambulatory Visit: Payer: Self-pay | Admitting: Neurology

## 2020-04-06 DIAGNOSIS — R251 Tremor, unspecified: Secondary | ICD-10-CM

## 2020-04-15 ENCOUNTER — Other Ambulatory Visit: Payer: 59

## 2020-04-20 ENCOUNTER — Other Ambulatory Visit: Payer: 59

## 2020-04-28 ENCOUNTER — Other Ambulatory Visit: Payer: Self-pay

## 2020-04-28 ENCOUNTER — Ambulatory Visit
Admission: RE | Admit: 2020-04-28 | Discharge: 2020-04-28 | Disposition: A | Discharge status: Home / Self Care | Payer: 59 | Source: Ambulatory Visit | Attending: Neurology | Admitting: Neurology

## 2020-04-28 DIAGNOSIS — R251 Tremor, unspecified: Secondary | ICD-10-CM | POA: Diagnosis not present

## 2020-04-28 MED ORDER — GADOBUTROL 1 MMOL/ML IV SOLN
7.0000 mL | Freq: Once | INTRAVENOUS | Status: AC | PRN
  Administered 2020-04-28: 7 mL via INTRAVENOUS

## 2020-04-29 ENCOUNTER — Other Ambulatory Visit: Payer: Self-pay | Admitting: Family Medicine

## 2020-04-29 DIAGNOSIS — N6320 Unspecified lump in the left breast, unspecified quadrant: Secondary | ICD-10-CM

## 2020-05-07 IMAGING — US US BREAST*L* LIMITED INC AXILLA
1 series · 9 of 9 positions shown · non-contrast
Comparison: Previous exam(s).

CLINICAL DATA: Screening recall for a possible left breast mass.

EXAM:
DIGITAL DIAGNOSTIC LEFT MAMMOGRAM WITH CAD AND TOMO
ULTRASOUND LEFT BREAST

[Series 1: us breast*left* limited inc axilla · 0.05mm/px · 9 of 9 slices shown]
[im 1/9]
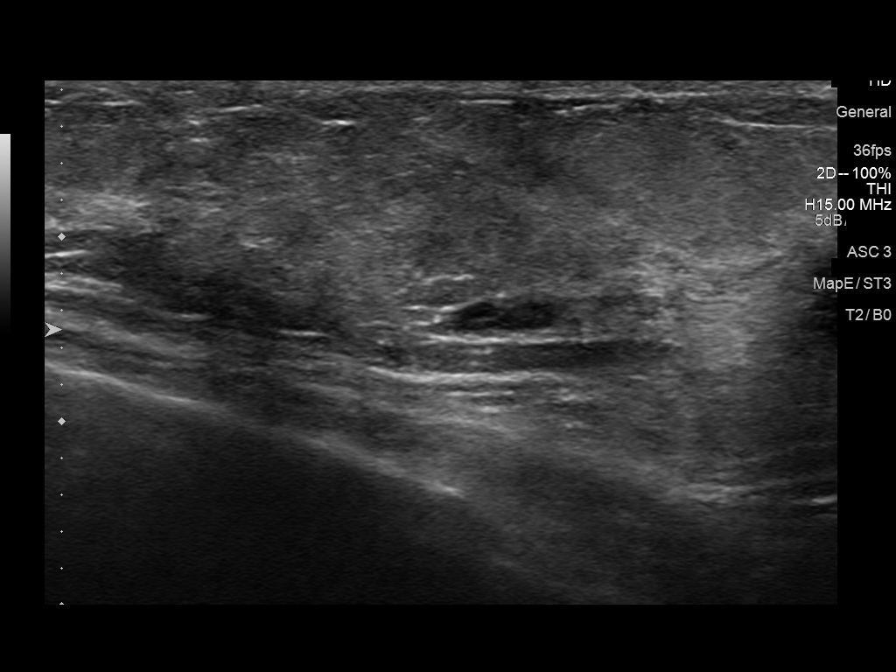
[im 2/9]
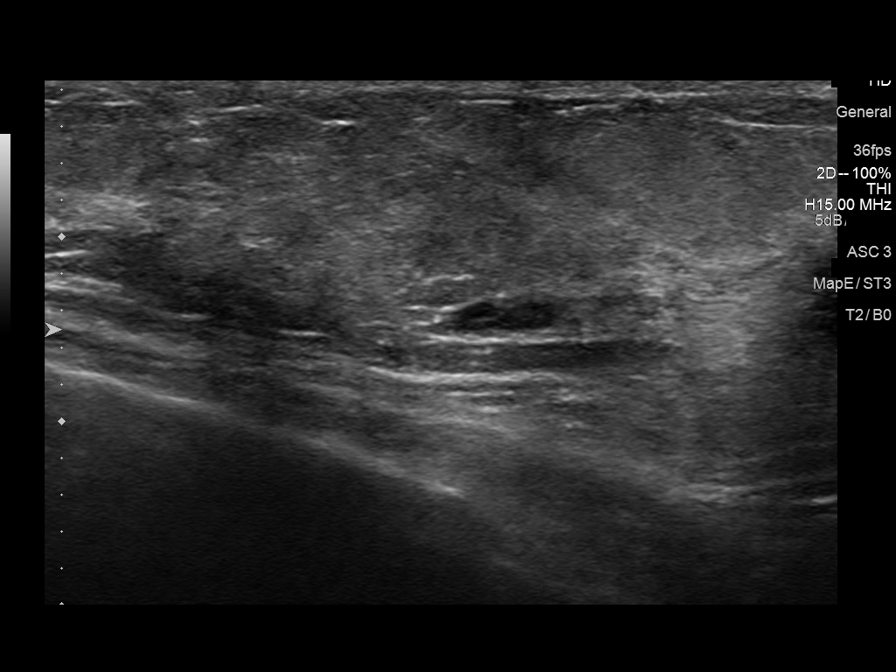
[im 3/9]
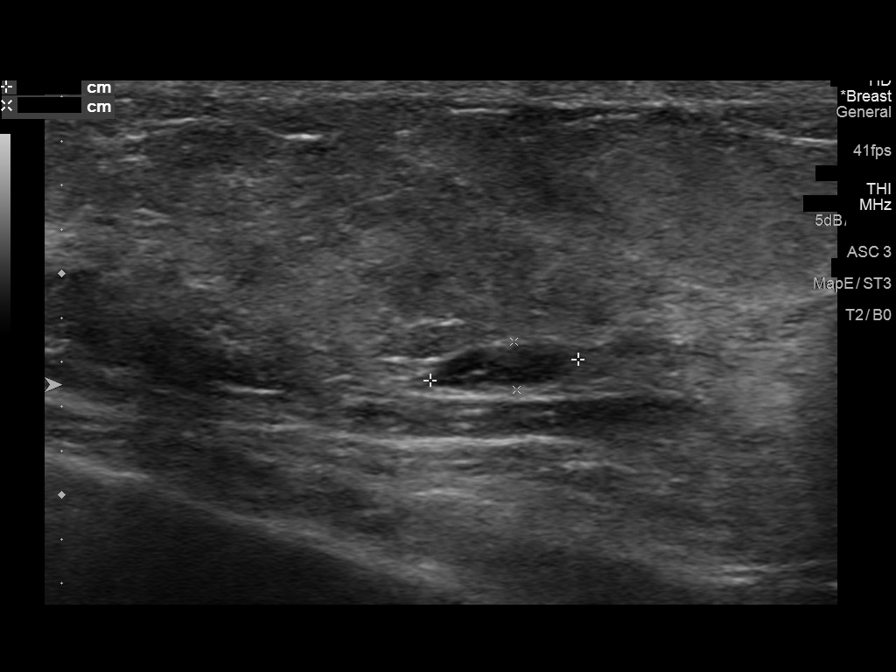
[im 4/9]
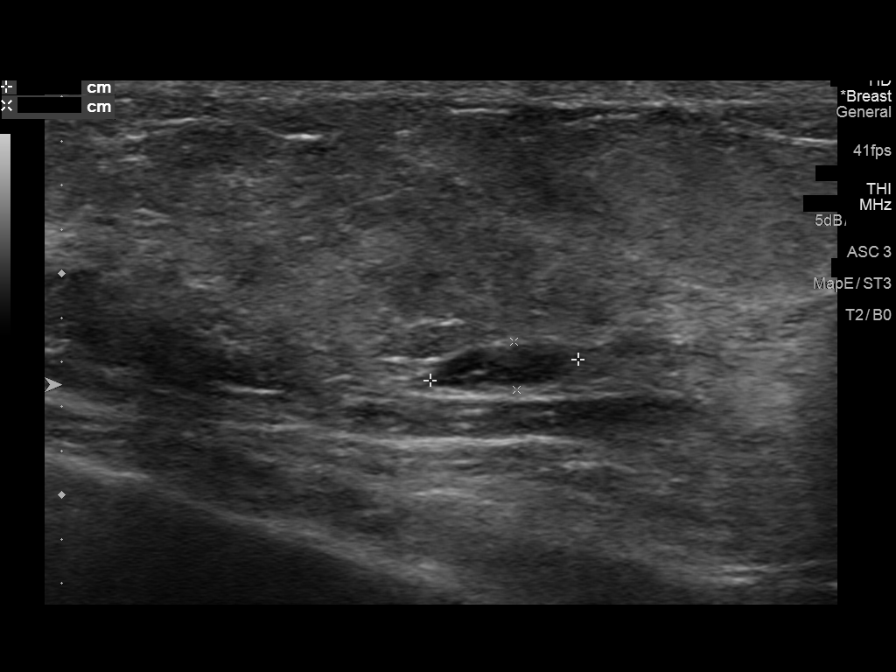
[im 5/9]
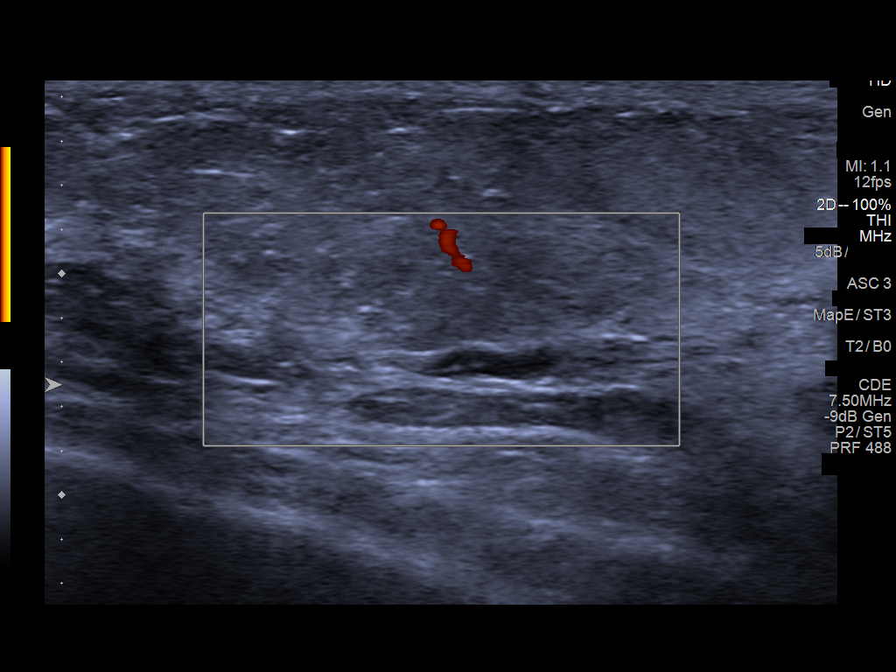
[im 6/9]
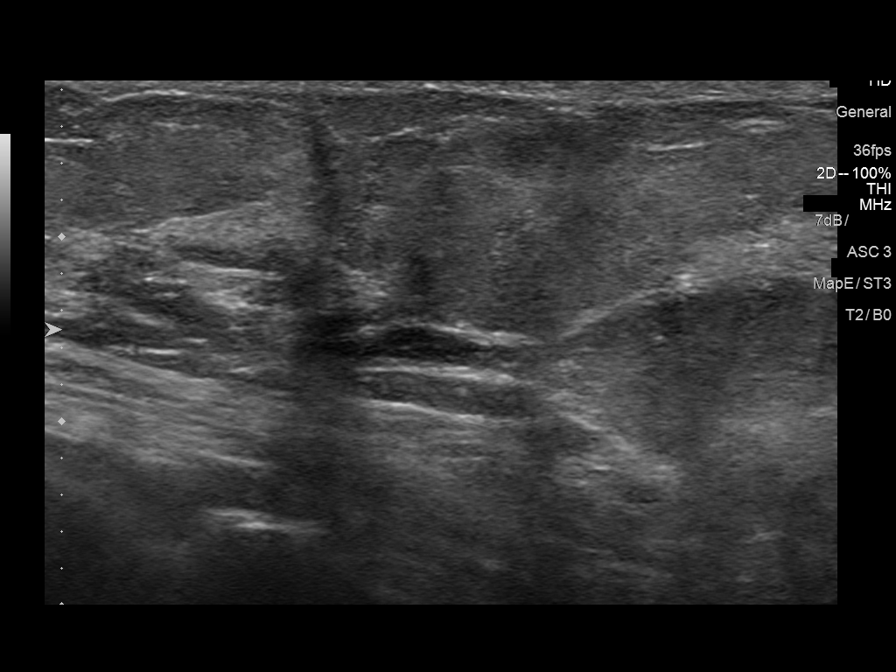
[im 7/9]
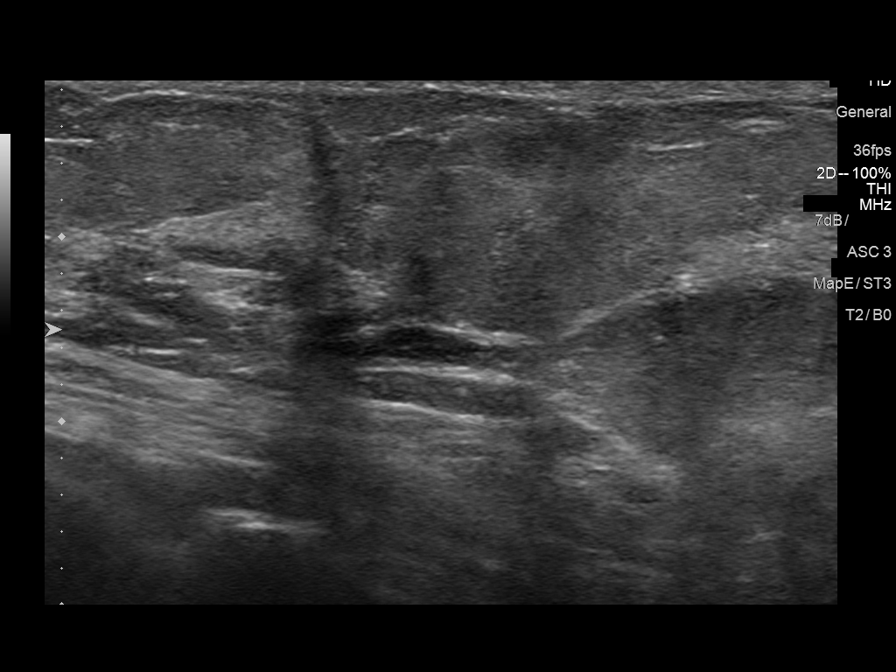
[im 8/9]
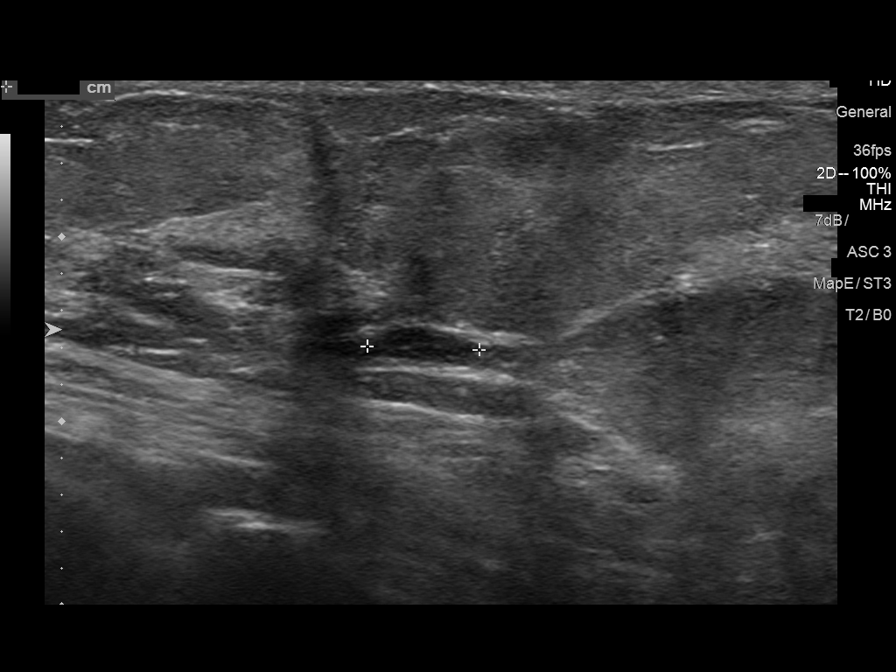
[im 9/9]
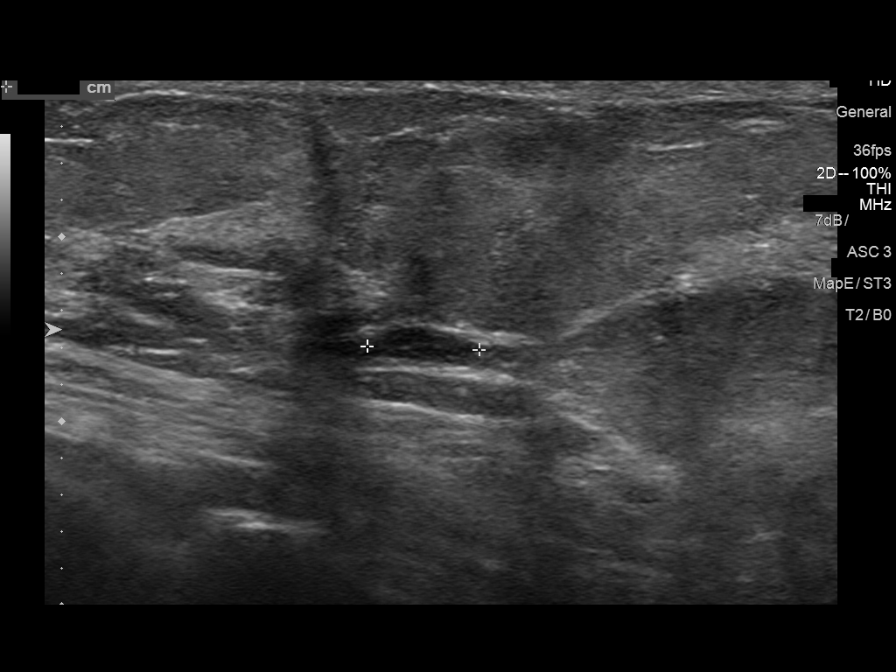

[9 of 9 positions shown; findings below may reference images not displayed]

ACR Breast Density Category c: The breast tissue is heterogeneously
dense, which may obscure small masses.
FINDINGS: There is a persistent obscured mass in the lower-inner quadrant
middle to posterior depth of the left breast. This measures
approximately 8 mm.

Mammographic images were processed with CAD.

On physical exam, no suspicious palpable masses are identified in
the lower-inner left breast.

Ultrasound targeted to the left breast at 7 o'clock, 5 cm from the
nipple demonstrates a circumscribed oval hypoechoic mass measuring
0.7 x 0.2 x 0.6 cm.
IMPRESSION: 1. There is a probably benign mass in the lower-inner quadrant of
the left breast.

RECOMMENDATION:
Six-month follow-up diagnostic left breast mammogram and ultrasound
is recommended.

I have discussed the findings and recommendations with the patient.
Results were also provided in writing at the conclusion of the
visit. If applicable, a reminder letter will be sent to the patient
regarding the next appointment.

BI-RADS CATEGORY  3: Probably benign.

## 2020-06-02 ENCOUNTER — Other Ambulatory Visit: Payer: Self-pay | Admitting: General Surgery

## 2020-06-23 ENCOUNTER — Ambulatory Visit
Admission: RE | Admit: 2020-06-23 | Discharge: 2020-06-23 | Disposition: A | Discharge status: Home / Self Care | Payer: 59 | Source: Ambulatory Visit | Attending: Family Medicine | Admitting: Family Medicine

## 2020-06-23 DIAGNOSIS — N6320 Unspecified lump in the left breast, unspecified quadrant: Secondary | ICD-10-CM

## 2020-06-24 ENCOUNTER — Other Ambulatory Visit: Payer: Self-pay | Admitting: Certified Nurse Midwife

## 2020-07-08 ENCOUNTER — Other Ambulatory Visit: Payer: 59

## 2020-07-20 ENCOUNTER — Other Ambulatory Visit: Payer: 59

## 2020-07-25 ENCOUNTER — Other Ambulatory Visit: Payer: Self-pay | Admitting: Certified Nurse Midwife

## 2020-09-21 ENCOUNTER — Other Ambulatory Visit: Payer: Self-pay

## 2020-09-21 ENCOUNTER — Other Ambulatory Visit
Admission: RE | Admit: 2020-09-21 | Discharge: 2020-09-21 | Disposition: A | Discharge status: Home / Self Care | Payer: 59 | Source: Ambulatory Visit | Attending: General Surgery | Admitting: General Surgery

## 2020-09-21 DIAGNOSIS — Z01812 Encounter for preprocedural laboratory examination: Secondary | ICD-10-CM | POA: Diagnosis not present

## 2020-09-21 DIAGNOSIS — Z20822 Contact with and (suspected) exposure to covid-19: Secondary | ICD-10-CM | POA: Insufficient documentation

## 2020-09-21 LAB — SARS CORONAVIRUS 2 (TAT 6-24 HRS): SARS Coronavirus 2: NEGATIVE

## 2020-09-22 ENCOUNTER — Encounter: Payer: Self-pay | Admitting: General Surgery

## 2020-09-23 ENCOUNTER — Encounter
Admission: RE | Disposition: A | Discharge status: Home / Self Care | Payer: Self-pay | Source: Home / Self Care | Attending: General Surgery

## 2020-09-23 ENCOUNTER — Ambulatory Visit: Payer: 59 | Admitting: Anesthesiology

## 2020-09-23 ENCOUNTER — Encounter: Payer: Self-pay | Admitting: General Surgery

## 2020-09-23 ENCOUNTER — Other Ambulatory Visit: Payer: Self-pay

## 2020-09-23 ENCOUNTER — Ambulatory Visit
Admission: RE | Admit: 2020-09-23 | Discharge: 2020-09-23 | Disposition: A | Discharge status: Home / Self Care | Payer: 59 | Attending: General Surgery | Admitting: General Surgery

## 2020-09-23 DIAGNOSIS — Z8371 Family history of colonic polyps: Secondary | ICD-10-CM | POA: Insufficient documentation

## 2020-09-23 DIAGNOSIS — Z1211 Encounter for screening for malignant neoplasm of colon: Secondary | ICD-10-CM | POA: Diagnosis present

## 2020-09-23 DIAGNOSIS — Z79899 Other long term (current) drug therapy: Secondary | ICD-10-CM | POA: Diagnosis not present

## 2020-09-23 DIAGNOSIS — Z881 Allergy status to other antibiotic agents status: Secondary | ICD-10-CM | POA: Diagnosis not present

## 2020-09-23 DIAGNOSIS — Z87891 Personal history of nicotine dependence: Secondary | ICD-10-CM | POA: Insufficient documentation

## 2020-09-23 DIAGNOSIS — Q438 Other specified congenital malformations of intestine: Secondary | ICD-10-CM | POA: Insufficient documentation

## 2020-09-23 HISTORY — DX: Shortness of breath: R06.02

## 2020-09-23 HISTORY — DX: Tachycardia, unspecified: R00.0

## 2020-09-23 HISTORY — DX: Hyperlipidemia, unspecified: E78.5

## 2020-09-23 HISTORY — PX: COLONOSCOPY WITH PROPOFOL: SHX5780

## 2020-09-23 LAB — POCT PREGNANCY, URINE: Preg Test, Ur: NEGATIVE

## 2020-09-23 SURGERY — COLONOSCOPY WITH PROPOFOL
Anesthesia: General

## 2020-09-23 MED ORDER — SODIUM CHLORIDE 0.9 % IV SOLN: INTRAVENOUS | Status: DC

## 2020-09-23 MED ORDER — PROPOFOL 10 MG/ML IV BOLUS
INTRAVENOUS | Status: DC | PRN
  Administered 2020-09-23: 20 mg via INTRAVENOUS
  Administered 2020-09-23: 70 mg via INTRAVENOUS
  Administered 2020-09-23: 10 mg via INTRAVENOUS

## 2020-09-23 MED ORDER — PROPOFOL 500 MG/50ML IV EMUL
INTRAVENOUS | Status: DC | PRN
  Administered 2020-09-23: 200 ug/kg/min via INTRAVENOUS

## 2020-09-23 MED ORDER — PROPOFOL 500 MG/50ML IV EMUL
INTRAVENOUS | Status: AC
  Filled 2020-09-23: qty 50

## 2020-09-23 NOTE — Anesthesia Procedure Notes (Signed)
Date/Time: 09/23/2020 7:50 AM Performed by: Junious Silk, CRNA Pre-anesthesia Checklist: Patient identified, Emergency Drugs available, Suction available, Patient being monitored and Timeout performed Oxygen Delivery Method: Nasal cannula

## 2020-09-23 NOTE — Anesthesia Postprocedure Evaluation (Signed)
Anesthesia Post Note  Patient: CARRON MCMURRY  Procedure(s) Performed: COLONOSCOPY WITH PROPOFOL (N/A )  Patient location during evaluation: PACU Anesthesia Type: General Level of consciousness: awake and alert Pain management: pain level controlled Vital Signs Assessment: post-procedure vital signs reviewed and stable Respiratory status: spontaneous breathing, nonlabored ventilation, respiratory function stable and patient connected to nasal cannula oxygen Cardiovascular status: blood pressure returned to baseline and stable Postop Assessment: no apparent nausea or vomiting Anesthetic complications: no   No complications documented.   Last Vitals:  Vitals:   09/23/20 0834 09/23/20 0844  BP: (!) 107/55 111/83  Pulse: 83   Resp: 12 10  Temp: (!) 36.1 C   SpO2: 100%     Last Pain:  Vitals:   09/23/20 0844  TempSrc:   PainSc: 0-No pain                 Yevette Edwards

## 2020-09-23 NOTE — H&P (Signed)
Cheyenne Rodriguez [id number] [birth year]     HPI:  Healthy woman with family history of colon polyps.  Tolerated prep. For screening colonoscopy.   Medications Prior to Admission  Medication Sig Dispense Refill Last Dose  . amoxicillin (AMOXIL) 875 MG tablet Take by mouth 2 (two) times daily.   Past Week at Unknown time  . escitalopram (LEXAPRO) 10 MG tablet Take 10 mg by mouth daily.   09/22/2020 at 0800  . Multiple Vitamins-Minerals (MULTIVITAMIN ADULT PO) Take by mouth.   Past Week at Unknown time  . Norethindrone Acetate-Ethinyl Estradiol (JUNEL 1.5/30) 1.5-30 MG-MCG tablet Take 1 tablet by mouth daily. 1 Package 11 Past Week at Unknown time  . predniSONE (DELTASONE) 10 MG tablet Take 10 mg by mouth daily with breakfast.   Past Week at Unknown time   Allergies  Allergen Reactions  . Other Diarrhea    Z-Pak   Past Medical History:  Diagnosis Date  . Abnormal Pap smear of cervix   . Hyperlipidemia   . Shortness of breath   . Tachycardia    Past Surgical History:  Procedure Laterality Date  . BREAST BIOPSY Right 2009   neg  . CERVICAL BIOPSY  W/ LOOP ELECTRODE EXCISION    . COLPOSCOPY    . DILATION AND CURETTAGE OF UTERUS    . KNEE SURGERY  2007   Social History   Socioeconomic History  . Marital status: Married    Spouse name: Not on file  . Number of children: Not on file  . Years of education: Not on file  . Highest education level: Not on file  Occupational History  . Not on file  Tobacco Use  . Smoking status: Former Smoker    Packs/day: 1.00    Years: 2.00    Pack years: 2.00    Types: Cigarettes    Quit date: 1990    Years since quitting: 31.7  . Smokeless tobacco: Never Used  Vaping Use  . Vaping Use: Never used  Substance and Sexual Activity  . Alcohol use: Yes    Alcohol/week: 3.0 standard drinks    Types: 3 Glasses of wine per week    Comment: weekly  . Drug use: No  . Sexual activity: Yes    Birth control/protection: Pill  Other Topics Concern   . Not on file  Social History Narrative  . Not on file   Social Determinants of Health   Financial Resource Strain:   . Difficulty of Paying Living Expenses: Not on file  Food Insecurity:   . Worried About Programme researcher, broadcasting/film/video in the Last Year: Not on file  . Ran Out of Food in the Last Year: Not on file  Transportation Needs:   . Lack of Transportation (Medical): Not on file  . Lack of Transportation (Non-Medical): Not on file  Physical Activity:   . Days of Exercise per Week: Not on file  . Minutes of Exercise per Session: Not on file  Stress:   . Feeling of Stress : Not on file  Social Connections:   . Frequency of Communication with Friends and Family: Not on file  . Frequency of Social Gatherings with Friends and Family: Not on file  . Attends Religious Services: Not on file  . Active Member of Clubs or Organizations: Not on file  . Attends Banker Meetings: Not on file  . Marital Status: Not on file  Intimate Partner Violence:   . Fear of Current or Ex-Partner:  Not on file  . Emotionally Abused: Not on file  . Physically Abused: Not on file  . Sexually Abused: Not on file   Social History   Social History Narrative  . Not on file     ROS: Negative.     PE: HEENT: Negative. Lungs: Clear. Cardio: RR. Marland Kitchen Assessment/Plan:  Proceed with planned colonoscopy.   Merrily Pew [facility] 09/23/2020

## 2020-09-23 NOTE — Anesthesia Preprocedure Evaluation (Signed)
Anesthesia Evaluation  Patient identified by MRN, date of birth, ID band Patient awake    Reviewed: Allergy & Precautions, H&P , NPO status , Patient's Chart, lab work & pertinent test results, reviewed documented beta blocker date and time   Airway Mallampati: II   Neck ROM: full    Dental  (+) Poor Dentition   Pulmonary shortness of breath, former smoker,    Pulmonary exam normal        Cardiovascular Exercise Tolerance: Good negative cardio ROS Normal cardiovascular exam Rhythm:regular Rate:Normal     Neuro/Psych negative neurological ROS  negative psych ROS   GI/Hepatic negative GI ROS, Neg liver ROS,   Endo/Other  negative endocrine ROS  Renal/GU negative Renal ROS  negative genitourinary   Musculoskeletal   Abdominal   Peds  Hematology negative hematology ROS (+)   Anesthesia Other Findings Past Medical History: No date: Abnormal Pap smear of cervix No date: Hyperlipidemia No date: Shortness of breath No date: Tachycardia Past Surgical History: 2009: BREAST BIOPSY; Right     Comment:  neg No date: CERVICAL BIOPSY  W/ LOOP ELECTRODE EXCISION No date: COLPOSCOPY No date: DILATION AND CURETTAGE OF UTERUS 2007: KNEE SURGERY BMI    Body Mass Index: 23.71 kg/m     Reproductive/Obstetrics negative OB ROS                             Anesthesia Physical Anesthesia Plan  ASA: II  Anesthesia Plan: General   Post-op Pain Management:    Induction:   PONV Risk Score and Plan:   Airway Management Planned:   Additional Equipment:   Intra-op Plan:   Post-operative Plan:   Informed Consent: I have reviewed the patients History and Physical, chart, labs and discussed the procedure including the risks, benefits and alternatives for the proposed anesthesia with the patient or authorized representative who has indicated his/her understanding and acceptance.     Dental  Advisory Given  Plan Discussed with: CRNA  Anesthesia Plan Comments:         Anesthesia Quick Evaluation

## 2020-09-23 NOTE — Progress Notes (Signed)
°   09/23/20 0745  Clinical Encounter Type  Visited With Family  Visit Type Initial  Referral From Chaplain  Consult/Referral To Chaplain  While rounding [facility] waiting area, chaplain briefly visited with Pt's mother to see how she was doing while waiting. Pt's mother said she was awesome. She said she was so awesome, that it will make yesterday jealous.

## 2020-09-23 NOTE — Op Note (Signed)
[facility] Gastroenterology Patient Name: Cheyenne Rodriguez Procedure Date: 09/23/2020 7:06 AM MRN: [id number] Account #: [id number] Date of Birth: [birth year] Admit Type: Outpatient Age: 49 Room: [facility] ENDO ROOM 1 Gender: Female Note Status: Finalized Procedure:             Colonoscopy Indications:           Screening for colorectal malignant neoplasm Providers:             Earline Mayotte, MD Referring MD:          Rhona Leavens. Burnett Sheng, MD (Referring MD) Medicines:             Monitored Anesthesia Care Complications:         No immediate complications. Procedure:             Pre-Anesthesia Assessment:                        - Prior to the procedure, a History and Physical was                         performed, and patient medications, allergies and                         sensitivities were reviewed. The patient's tolerance                         of previous anesthesia was reviewed.                        - The risks and benefits of the procedure and the                         sedation options and risks were discussed with the                         patient. All questions were answered and informed                         consent was obtained.                        After obtaining informed consent, the colonoscope was                         passed under direct vision. Throughout the procedure,                         the patient's blood pressure, pulse, and oxygen                         saturations were monitored continuously. The                         Colonoscope was introduced through the anus and                         advanced to the the cecum, identified by appendiceal                         orifice and  ileocecal valve. The colonoscopy was                         somewhat difficult due to significant looping.                         Successful completion of the procedure was aided by                         using manual pressure. The patient tolerated  the                         procedure well. The quality of the bowel preparation                         was adequate to identify polyps 6 mm and larger in                         size. Findings:      The entire examined colon appeared normal on direct and retroflexion       views. Impression:            - The entire examined colon is normal on direct and                         retroflexion views.                        - No specimens collected. Recommendation:        - Repeat colonoscopy in 5 years for screening purposes.                        Large volume of semi-liquid stool noted, 1.5L                         requiring prolonged manipulation/ irrigation for                         complete mucosal visulalization. Procedure Code(s):     --- Professional ---                        [phone], Colonoscopy, flexible; diagnostic, including                         collection of specimen(s) by brushing or washing, when                         performed (separate procedure) Diagnosis Code(s):     --- Professional ---                        Z12.11, Encounter for screening for malignant neoplasm                         of colon CPT copyright 2019 American Medical Association. All rights reserved. The codes documented in this report are preliminary and upon coder review may  be revised to meet current compliance requirements. Earline Mayotte, MD 09/23/2020 8:32:53 AM This report has been signed electronically. Number of Addenda: 0 Note  Initiated On: 09/23/2020 7:06 AM Scope Withdrawal Time: 0 hours 21 minutes 53 seconds  Total Procedure Duration: 0 hours 40 minutes 50 seconds       [facility]

## 2020-09-23 NOTE — Transfer of Care (Signed)
Immediate Anesthesia Transfer of Care Note  Patient: Cheyenne Rodriguez  Procedure(s) Performed: COLONOSCOPY WITH PROPOFOL (N/A )  Patient Location: PACU  Anesthesia Type:General  Level of Consciousness: awake, alert  and oriented  Airway & Oxygen Therapy: Patient Spontanous Breathing and Patient connected to nasal cannula oxygen  Post-op Assessment: Report given to RN and Post -op Vital signs reviewed and stable  Post vital signs: Reviewed and stable  Last Vitals:  Vitals Value Taken Time  BP 107/55 09/23/20 0835  Temp 36.1 C 09/23/20 0834  Pulse 74 09/23/20 0837  Resp 17 09/23/20 0838  SpO2 98 % 09/23/20 0837  Vitals shown include unvalidated device data.  Last Pain:  Vitals:   09/23/20 0834  TempSrc: Temporal  PainSc:          Complications: No complications documented.

## 2020-09-23 NOTE — Addendum Note (Signed)
Addendum  created 09/23/20 0903 by Junious Silk, CRNA   Intraprocedure Event edited

## 2020-09-24 ENCOUNTER — Encounter: Payer: Self-pay | Admitting: General Surgery

## 2020-09-30 ENCOUNTER — Other Ambulatory Visit: Payer: Self-pay | Admitting: Certified Nurse Midwife

## 2020-10-21 ENCOUNTER — Ambulatory Visit (INDEPENDENT_AMBULATORY_CARE_PROVIDER_SITE_OTHER): Payer: 59 | Admitting: Certified Nurse Midwife

## 2020-10-21 ENCOUNTER — Encounter: Payer: Self-pay | Admitting: Certified Nurse Midwife

## 2020-10-21 ENCOUNTER — Other Ambulatory Visit (HOSPITAL_COMMUNITY)
Admission: RE | Admit: 2020-10-21 | Discharge: 2020-10-21 | Disposition: A | Discharge status: Home / Self Care | Payer: 59 | Source: Ambulatory Visit | Attending: Certified Nurse Midwife | Admitting: Certified Nurse Midwife

## 2020-10-21 ENCOUNTER — Other Ambulatory Visit: Payer: Self-pay

## 2020-10-21 VITALS — Ht 71.0 in | Wt 181.3 lb

## 2020-10-21 DIAGNOSIS — Z124 Encounter for screening for malignant neoplasm of cervix: Secondary | ICD-10-CM | POA: Diagnosis not present

## 2020-10-21 DIAGNOSIS — Z01419 Encounter for gynecological examination (general) (routine) without abnormal findings: Secondary | ICD-10-CM | POA: Insufficient documentation

## 2020-10-21 DIAGNOSIS — Z1231 Encounter for screening mammogram for malignant neoplasm of breast: Secondary | ICD-10-CM

## 2020-10-21 DIAGNOSIS — Z23 Encounter for immunization: Secondary | ICD-10-CM | POA: Diagnosis not present

## 2020-10-21 MED ORDER — NORETHINDRONE ACET-ETHINYL EST 1.5-30 MG-MCG PO TABS
1.0000 | ORAL_TABLET | Freq: Every day | ORAL | 11 refills | Status: DC
Start: 2020-10-21 — End: 2021-07-16

## 2020-10-21 NOTE — Patient Instructions (Signed)

## 2020-10-21 NOTE — Progress Notes (Signed)
GYNECOLOGY ANNUAL PREVENTATIVE CARE ENCOUNTER NOTE  History:     Cheyenne Rodriguez is a 49 y.o. [phone] female here for a routine annual gynecologic exam.  Current complaints: none.   Denies abnormal vaginal bleeding, discharge, pelvic pain, problems with intercourse or other gynecologic concerns.     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 (Menstrual status: Oral contraceptives). Contraception: OCP (estrogen/progesterone) Last Pap: 10/14/19 Results were: normal with negative HPV (request annual due to sign. Family hx cancer). Last mammogram: 06/14/20. Results were: normal   Upstream - 10/21/20 0814      Pregnancy Intention Screening   Does the patient want to become pregnant in the next year? No    Does the patient's partner want to become pregnant in the next year? No    Would the patient like to discuss contraceptive options today? No      Contraception Wrap Up   Current Method Oral Contraceptive    End Method Oral Contraceptive    Contraception Counseling Provided No          The pregnancy intention screening data noted above was reviewed. Potential methods of contraception were discussed. The patient elected to proceed with Oral Contraceptive.   Obstetric History OB History  Gravida Para Term Preterm AB Living  3 2 2   1 2   SAB TAB Ectopic Multiple Live Births  1       2    # Outcome Date GA Lbr Len/2nd Weight Sex Delivery Anes PTL Lv  3 Term 1997   9 lb 12.8 oz (4.445 kg) M Vag-Spont   LIV  2 SAB 1995          1 Term 1991   8 lb 14.4 oz (4.037 kg) F Vag-Spont   LIV    Obstetric Comments  1st Menstrual Cycle:  12  1st Pregnancy: 18    Past Medical History:  Diagnosis Date  . Abnormal Pap smear of cervix   . Hyperlipidemia   . Shortness of  breath   . Tachycardia   . Non essential tremor   Past Surgical History:  Procedure Laterality Date  . BREAST BIOPSY Right 2009   neg  . CERVICAL BIOPSY  W/ LOOP ELECTRODE EXCISION    . COLONOSCOPY WITH PROPOFOL N/A 09/23/2020   Procedure: COLONOSCOPY WITH PROPOFOL;  Surgeon: 09/25/2020, MD;  Location: [facility] ENDOSCOPY;  Service: Endoscopy;  Laterality: N/A;  . COLPOSCOPY    . DILATION AND CURETTAGE OF UTERUS    . KNEE SURGERY  2007    Current Outpatient Medications on File Prior to Visit  Medication Sig Dispense Refill  . AUROVELA 1.5/30 1.5-30 MG-MCG tablet TAKE 1 TABLET BY MOUTH EVERY DAY 28 tablet 0  . escitalopram (LEXAPRO) 10 MG tablet Take 10 mg by mouth daily.    . Multiple Vitamins-Minerals (MULTIVITAMIN ADULT PO) Take by mouth.    10-29-1978 amoxicillin (AMOXIL) 875 MG tablet Take by mouth 2 (two) times daily.    . predniSONE (DELTASONE) 10 MG tablet Take 10 mg by mouth daily with breakfast.     No current facility-administered medications on file prior to visit.    Allergies  Allergen Reactions  . Other Diarrhea    Z-Pak    Social History:  reports that she quit smoking about 31 years ago. Her smoking use  included cigarettes. She has a 2.00 pack-year smoking history. She has never used smokeless tobacco. She reports current alcohol use of about 3.0 standard drinks of alcohol per week. She reports that she does not use drugs.  Family History  Problem Relation Age of Onset  . Colon polyps Father 71  . Colon cancer Father   . Ovarian cancer Paternal Grandmother   . Breast cancer Neg Hx   . Diabetes Neg Hx     The following portions of the patient's history were reviewed and updated as appropriate: allergies, current medications, past family history, past medical history, past social history, past surgical history and problem list.  Review of Systems Pertinent items noted in HPI and remainder of comprehensive ROS otherwise negative.  Physical Exam:  Ht 5\' 11"   (1.803 m)   Wt 181 lb 5 oz (82.2 kg)   LMP  (LMP Unknown)   BMI 25.29 kg/m  CONSTITUTIONAL: Well-developed, well-nourished female in no acute distress.  HENT:  Normocephalic, atraumatic, External right and left ear normal. Oropharynx is clear and moist EYES: Conjunctivae and EOM are normal. Pupils are equal, round, and reactive to light. No scleral icterus.  NECK: Normal range of motion, supple, no masses.  Normal thyroid.  SKIN: Skin is warm and dry. No rash noted. Not diaphoretic. No erythema. No pallor. MUSCULOSKELETAL: Normal range of motion. No tenderness.  No cyanosis, clubbing, or edema.  2+ distal pulses. NEUROLOGIC: Alert and oriented to person, place, and time. Normal reflexes, muscle tone coordination.  PSYCHIATRIC: Normal mood and affect. Normal behavior. Normal judgment and thought content. CARDIOVASCULAR: Normal heart rate noted, regular rhythm RESPIRATORY: Clear to auscultation bilaterally. Effort and breath sounds normal, no problems with respiration noted. BREASTS: Symmetric in size. No masses, tenderness, skin changes, nipple drainage, or lymphadenopathy bilaterally.  ABDOMEN: Soft, no distention noted.  No tenderness, rebound or guarding.  PELVIC: Normal appearing external genitalia and urethral meatus; normal appearing vaginal mucosa and cervix.  No abnormal discharge noted.  Pap smear obtained.  Normal uterine size, no other palpable masses, no uterine or adnexal tenderness.  .   Assessment and Plan:    1. Women's annual routine gynecological examination   Pap:Will follow up results of pap smear and manage accordingly .Mammogram :ordered Colonoscopy: 09/23/20, recommend repeat 5 yrs.2026 Labs: will have Hep C done at PCP in Dec.  Refills:ocp Referral:none Routine preventative health maintenance measures emphasized. Please refer to After Visit Summary for other counseling recommendations.      Jan, CNM [facility],   [facility]

## 2020-10-22 ENCOUNTER — Other Ambulatory Visit: Payer: Self-pay | Admitting: Certified Nurse Midwife

## 2020-10-23 LAB — CYTOLOGY - PAP
Adequacy: ABSENT
Diagnosis: NEGATIVE

## 2020-12-10 ENCOUNTER — Telehealth: Payer: Self-pay | Admitting: Nurse Practitioner

## 2020-12-10 DIAGNOSIS — U071 COVID-19: Secondary | ICD-10-CM

## 2020-12-10 NOTE — Telephone Encounter (Signed)
Called to discuss with Cheyenne Rodriguez about Covid symptoms and the use of a monoclonal antibody infusion for those with mild to moderate Covid symptoms and at a high risk of hospitalization.     Pt is qualified for this infusion at the [facility] infusion center due to co-morbid conditions and/or a member of an at-risk group, however declines infusion at this time. Patient is fully vaccinated and feels symptoms are mild and nearly resolved.  Symptoms tier reviewed as well as criteria for ending isolation.  Symptoms reviewed that would warrant ED/Hospital evaluation. Preventative practices reviewed. Patient verbalized understanding. Patient advised to call back if he/she opts to proceed with infusion. Callback number provided. Urgent care and/or ER precautions given for severe symptoms. Last date eligible for infusion: 12/16/20.    SVI-4 There are no problems to display for this patient.   Consuello Masse, NP [phone] [name]

## 2020-12-12 ENCOUNTER — Encounter: Payer: Self-pay | Admitting: Oncology

## 2020-12-12 ENCOUNTER — Other Ambulatory Visit: Payer: Self-pay | Admitting: Oncology

## 2020-12-12 ENCOUNTER — Ambulatory Visit (HOSPITAL_COMMUNITY)
Admission: RE | Admit: 2020-12-12 | Discharge: 2020-12-12 | Disposition: A | Discharge status: Home / Self Care | Payer: 59 | Source: Ambulatory Visit | Attending: Pulmonary Disease | Admitting: Pulmonary Disease

## 2020-12-12 DIAGNOSIS — U071 COVID-19: Secondary | ICD-10-CM

## 2020-12-12 MED ORDER — EPINEPHRINE 0.3 MG/0.3ML IJ SOAJ: 0.3000 mg | Freq: Once | INTRAMUSCULAR | Status: DC | PRN

## 2020-12-12 MED ORDER — METHYLPREDNISOLONE SODIUM SUCC 125 MG IJ SOLR: 125.0000 mg | Freq: Once | INTRAMUSCULAR | Status: DC | PRN

## 2020-12-12 MED ORDER — ALBUTEROL SULFATE HFA 108 (90 BASE) MCG/ACT IN AERS: 2.0000 | INHALATION_SPRAY | Freq: Once | RESPIRATORY_TRACT | Status: DC | PRN

## 2020-12-12 MED ORDER — SODIUM CHLORIDE 0.9 % IV SOLN: INTRAVENOUS | Status: DC | PRN

## 2020-12-12 MED ORDER — SODIUM CHLORIDE 0.9 % IV SOLN: Freq: Once | INTRAVENOUS | Status: AC

## 2020-12-12 MED ORDER — DIPHENHYDRAMINE HCL 50 MG/ML IJ SOLN: 50.0000 mg | Freq: Once | INTRAMUSCULAR | Status: DC | PRN

## 2020-12-12 MED ORDER — FAMOTIDINE IN NACL 20-0.9 MG/50ML-% IV SOLN: 20.0000 mg | Freq: Once | INTRAVENOUS | Status: DC | PRN

## 2020-12-12 NOTE — Discharge Instructions (Signed)
10 Things You Can Do to Manage Your COVID-19 Symptoms at Home If you have possible or confirmed COVID-19: 1. Stay home from work and school. And stay away from other public places. If you must go out, avoid using any kind of public transportation, ridesharing, or taxis. 2. Monitor your symptoms carefully. If your symptoms get worse, call your healthcare provider immediately. 3. Get rest and stay hydrated. 4. If you have a medical appointment, call the healthcare provider ahead of time and tell them that you have or may have COVID-19. 5. For medical emergencies, call 911 and notify the dispatch personnel that you have or may have COVID-19. 6. Cover your cough and sneezes with a tissue or use the inside of your elbow. 7. Wash your hands often with soap and water for at least 20 seconds or clean your hands with an alcohol-based hand sanitizer that contains at least 60% alcohol. 8. As much as possible, stay in a specific room and away from other people in your home. Also, you should use a separate bathroom, if available. If you need to be around other people in or outside of the home, wear a mask. 9. Avoid sharing personal items with other people in your household, like dishes, towels, and bedding. 10. Clean all surfaces that are touched often, like counters, tabletops, and doorknobs. Use household cleaning sprays or wipes according to the label instructions. cdc.gov/coronavirus 06/26/2019 This information is not intended to replace advice given to you by your health care provider. Make sure you discuss any questions you have with your health care provider. Document Revised: 11/28/2019 Document Reviewed: 11/28/2019 Elsevier Patient Education  2020 Elsevier Inc. What types of side effects do monoclonal antibody drugs cause?  Common side effects  In general, the more common side effects caused by monoclonal antibody drugs include: . Allergic reactions, such as hives or itching . Flu-like signs and  symptoms, including chills, fatigue, fever, and muscle aches and pains . Nausea, vomiting . Diarrhea . Skin rashes . Low blood pressure   The CDC is recommending patients who receive monoclonal antibody treatments wait at least 90 days before being vaccinated.  Currently, there are no data on the safety and efficacy of mRNA COVID-19 vaccines in persons who received monoclonal antibodies or convalescent plasma as part of COVID-19 treatment. Based on the estimated half-life of such therapies as well as evidence suggesting that reinfection is uncommon in the 90 days after initial infection, vaccination should be deferred for at least 90 days, as a precautionary measure until additional information becomes available, to avoid interference of the antibody treatment with vaccine-induced immune responses. If you have any questions or concerns after the infusion please call the Advanced Practice Provider on call at [phone]. This number is ONLY intended for your use regarding questions or concerns about the infusion post-treatment side-effects.  Please do not provide this number to others for use. For return to work notes please contact your primary care provider.   If someone you know is interested in receiving treatment please have them call the COVID hotline at [phone].   

## 2020-12-12 NOTE — Progress Notes (Signed)
  Diagnosis: COVID-19  Physician: Dr. Patrick Wright  Procedure: Covid Infusion Clinic Med: bamlanivimab\etesevimab infusion - Provided patient with bamlanimivab\etesevimab fact sheet for patients, parents and caregivers prior to infusion.  Complications: No immediate complications noted.  Discharge: Discharged home   Claudett Bayly 12/12/2020  

## 2020-12-12 NOTE — Progress Notes (Signed)
Patient reviewed Fact Sheet for Patients, Parents, and Caregivers for Emergency Use Authorization (EUA) of bamlanivimab and etesevimab for the Treatment of Coronavirus. Patient also reviewed and is agreeable to the estimated cost of treatment. Patient is agreeable to proceed.   

## 2020-12-12 NOTE — Progress Notes (Signed)
Re: [facility]ab Infusion  Called to Discuss with patient about Covid symptoms and the use of regeneron, a monoclonal antibody infusion for those with mild to moderate Covid symptoms and at a high risk of hospitalization.     Pt is qualified for this infusion at the [facility] center due to co-morbid conditions and/or a member of an at-risk group.    Past Medical History:  Diagnosis Date  . Abnormal Pap smear of cervix   . Hyperlipidemia   . Shortness of breath   . Tachycardia     Specific risk condition-SVI-4   Unable to reach pt. Left VM and MCM.  Mignon Pine, AGNP-C [phone] (Infusion Center Hotline)

## 2021-05-07 ENCOUNTER — Telehealth: Payer: Self-pay | Admitting: Certified Nurse Midwife

## 2021-05-07 MED ORDER — ACYCLOVIR 400 MG PO TABS: 400.0000 mg | ORAL_TABLET | Freq: Three times a day (TID) | ORAL | 1 refills | Status: AC

## 2021-05-07 NOTE — Telephone Encounter (Signed)
She was diagnosed when she was in her 55 and it would come and go sporadically. She has not had a flare in awhile. She has pain only when she has to wipes.  Onset was Monday.

## 2021-05-07 NOTE — Telephone Encounter (Signed)
New Message:  Pt states she is having a herpes flare and is out of medication.  She hasn't had a flare in many years but this time its pretty severe and painful.  She stated that most of the time it will clear on it's own but this one is not.  She can't remember what medication she takes.  Her pharmacy is [facility] at [place] and [address]

## 2021-06-24 ENCOUNTER — Other Ambulatory Visit: Payer: Self-pay

## 2021-06-24 ENCOUNTER — Ambulatory Visit
Admission: RE | Admit: 2021-06-24 | Discharge: 2021-06-24 | Disposition: A | Discharge status: Home / Self Care | Payer: BC Managed Care – PPO | Source: Ambulatory Visit | Attending: Certified Nurse Midwife | Admitting: Certified Nurse Midwife

## 2021-06-24 DIAGNOSIS — Z1231 Encounter for screening mammogram for malignant neoplasm of breast: Secondary | ICD-10-CM | POA: Insufficient documentation

## 2021-06-24 DIAGNOSIS — Z01419 Encounter for gynecological examination (general) (routine) without abnormal findings: Secondary | ICD-10-CM

## 2021-06-24 DIAGNOSIS — N6310 Unspecified lump in the right breast, unspecified quadrant: Secondary | ICD-10-CM | POA: Diagnosis not present

## 2021-06-28 ENCOUNTER — Other Ambulatory Visit: Payer: Self-pay | Admitting: Certified Nurse Midwife

## 2021-07-01 ENCOUNTER — Other Ambulatory Visit: Payer: Self-pay | Admitting: Certified Nurse Midwife

## 2021-07-01 DIAGNOSIS — R928 Other abnormal and inconclusive findings on diagnostic imaging of breast: Secondary | ICD-10-CM

## 2021-07-01 DIAGNOSIS — N6310 Unspecified lump in the right breast, unspecified quadrant: Secondary | ICD-10-CM

## 2021-07-09 ENCOUNTER — Ambulatory Visit
Admission: RE | Admit: 2021-07-09 | Discharge: 2021-07-09 | Disposition: A | Discharge status: Home / Self Care | Payer: BC Managed Care – PPO | Source: Ambulatory Visit | Attending: Certified Nurse Midwife | Admitting: Certified Nurse Midwife

## 2021-07-09 ENCOUNTER — Other Ambulatory Visit: Payer: Self-pay

## 2021-07-09 DIAGNOSIS — N6310 Unspecified lump in the right breast, unspecified quadrant: Secondary | ICD-10-CM

## 2021-07-09 DIAGNOSIS — R928 Other abnormal and inconclusive findings on diagnostic imaging of breast: Secondary | ICD-10-CM

## 2021-07-16 ENCOUNTER — Other Ambulatory Visit: Payer: Self-pay | Admitting: Certified Nurse Midwife

## 2021-08-02 ENCOUNTER — Other Ambulatory Visit: Payer: Self-pay | Admitting: Certified Nurse Midwife

## 2021-08-22 ENCOUNTER — Other Ambulatory Visit: Payer: Self-pay | Admitting: Certified Nurse Midwife

## 2021-09-10 ENCOUNTER — Other Ambulatory Visit: Payer: Self-pay | Admitting: Certified Nurse Midwife

## 2021-10-18 ENCOUNTER — Other Ambulatory Visit: Payer: Self-pay

## 2021-10-18 ENCOUNTER — Ambulatory Visit (INDEPENDENT_AMBULATORY_CARE_PROVIDER_SITE_OTHER): Payer: BC Managed Care – PPO | Admitting: Certified Nurse Midwife

## 2021-10-18 ENCOUNTER — Encounter: Payer: Self-pay | Admitting: Certified Nurse Midwife

## 2021-10-18 ENCOUNTER — Other Ambulatory Visit (HOSPITAL_COMMUNITY)
Admission: RE | Admit: 2021-10-18 | Discharge: 2021-10-18 | Disposition: A | Discharge status: Home / Self Care | Payer: BC Managed Care – PPO | Source: Ambulatory Visit | Attending: Certified Nurse Midwife | Admitting: Certified Nurse Midwife

## 2021-10-18 VITALS — BP 120/84 | HR 80 | Ht 71.0 in | Wt 178.5 lb

## 2021-10-18 DIAGNOSIS — Z1231 Encounter for screening mammogram for malignant neoplasm of breast: Secondary | ICD-10-CM | POA: Diagnosis not present

## 2021-10-18 DIAGNOSIS — Z01419 Encounter for gynecological examination (general) (routine) without abnormal findings: Secondary | ICD-10-CM

## 2021-10-18 DIAGNOSIS — Z124 Encounter for screening for malignant neoplasm of cervix: Secondary | ICD-10-CM

## 2021-10-18 MED ORDER — NORETHINDRONE ACET-ETHINYL EST 1.5-30 MG-MCG PO TABS: 1.0000 | ORAL_TABLET | Freq: Every day | ORAL | 11 refills | Status: DC

## 2021-10-18 NOTE — Patient Instructions (Signed)
Preventive Care 51-51 Years Old, Female Preventive care refers to lifestyle choices and visits with your health care provider that can promote health and wellness. This includes: A yearly physical exam. This is also called an annual wellness visit. Regular dental and eye exams. Immunizations. Screening for certain conditions. Healthy lifestyle choices, such as: Eating a healthy diet. Getting regular exercise. Not using drugs or products that contain nicotine and tobacco. Limiting alcohol use. What can I expect for my preventive care visit? Physical exam Your health care provider will check your: Height and weight. These may be used to calculate your BMI (body mass index). BMI is a measurement that tells if you are at a healthy weight. Heart rate and blood pressure. Body temperature. Skin for abnormal spots. Counseling Your health care provider may ask you questions about your: Past medical problems. Family's medical history. Alcohol, tobacco, and drug use. Emotional well-being. Home life and relationship well-being. Sexual activity. Diet, exercise, and sleep habits. Work and work environment. Access to firearms. Method of birth control. Menstrual cycle. Pregnancy history. What immunizations do I need? Vaccines are usually given at various ages, according to a schedule. Your health care provider will recommend vaccines for you based on your age, medical history, and lifestyle or other factors, such as travel or where you work. What tests do I need? Blood tests Lipid and cholesterol levels. These may be checked every 5 years, or more often if you are over 51 years old. Hepatitis C test. Hepatitis B test. Screening Lung cancer screening. You may have this screening every year starting at age 51 if you have a 30-pack-year history of smoking and currently smoke or have quit within the past 15 years. Colorectal cancer screening. All adults should have this screening starting at  age 51 and continuing until age 75. Your health care provider may recommend screening at age 45 if you are at increased risk. You will have tests every 1-10 years, depending on your results and the type of screening test. Diabetes screening. This is done by checking your blood sugar (glucose) after you have not eaten for a while (fasting). You may have this done every 1-3 years. Mammogram. This may be done every 1-2 years. Talk with your health care provider about when you should start having regular mammograms. This may depend on whether you have a family history of breast cancer. BRCA-related cancer screening. This may be done if you have a family history of breast, ovarian, tubal, or peritoneal cancers. Pelvic exam and Pap test. This may be done every 3 years starting at age 21. Starting at age 30, this may be done every 5 years if you have a Pap test in combination with an HPV test. Other tests STD (sexually transmitted disease) testing, if you are at risk. Bone density scan. This is done to screen for osteoporosis. You may have this scan if you are at high risk for osteoporosis. Talk with your health care provider about your test results, treatment options, and if necessary, the need for more tests. Follow these instructions at home: Eating and drinking  Eat a diet that includes fresh fruits and vegetables, whole grains, lean protein, and low-fat dairy products. Take vitamin and mineral supplements as recommended by your health care provider. Do not drink alcohol if: Your health care provider tells you not to drink. You are pregnant, may be pregnant, or are planning to become pregnant. If you drink alcohol: Limit how much you have to 0-1 drink a day. Be   aware of how much alcohol is in your drink. In the [place] one drink equals one 12 oz bottle of beer (355 mL), one 5 oz glass of wine (148 mL), or one 1 oz glass of hard liquor (44 mL). Lifestyle Take daily care of your teeth and  gums. Brush your teeth every morning and night with fluoride toothpaste. Floss one time each day. Stay active. Exercise for at least 30 minutes 5 or more days each week. Do not use any products that contain nicotine or tobacco, such as cigarettes, e-cigarettes, and chewing tobacco. If you need help quitting, ask your health care provider. Do not use drugs. If you are sexually active, practice safe sex. Use a condom or other form of protection to prevent STIs (sexually transmitted infections). If you do not wish to become pregnant, use a form of birth control. If you plan to become pregnant, see your health care provider for a prepregnancy visit. If told by your health care provider, take low-dose aspirin daily starting at age 51. Find healthy ways to cope with stress, such as: Meditation, yoga, or listening to music. Journaling. Talking to a trusted person. Spending time with friends and family. Safety Always wear your seat belt while driving or riding in a vehicle. Do not drive: If you have been drinking alcohol. Do not ride with someone who has been drinking. When you are tired or distracted. While texting. Wear a helmet and other protective equipment during sports activities. If you have firearms in your house, make sure you follow all gun safety procedures. What's next? Visit your health care provider once a year for an annual wellness visit. Ask your health care provider how often you should have your eyes and teeth checked. Stay up to date on all vaccines. This information is not intended to replace advice given to you by your health care provider. Make sure you discuss any questions you have with your health care provider. Document Revised: 02/19/2021 Document Reviewed: 08/23/2018 Elsevier Patient Education  2022 [employer].

## 2021-10-18 NOTE — Progress Notes (Signed)
GYNECOLOGY ANNUAL PREVENTATIVE CARE ENCOUNTER NOTE  History:     Cheyenne Rodriguez is a 50 y.o. G22P2012 female here for a routine annual gynecologic exam.  Current complaints: none.   Denies abnormal vaginal bleeding, discharge, pelvic pain, problems with intercourse or other gynecologic concerns.     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 (Menstrual status: Oral contraceptives). Contraception: OCP (estrogen/progesterone) Last Pap: 10/21/2020. Results were: normal with negative HPV (like to have done annually due to family hx.) Last mammogram: 05/2021. Results were: normal  Obstetric History OB History  Gravida Para Term Preterm AB Living  3 2 2   1 2   SAB IAB Ectopic Multiple Live Births  1       2    # Outcome Date GA Lbr Len/2nd Weight Sex Delivery Anes PTL Lv  3 Term 1997   9 lb 12.8 oz (4.445 kg) M Vag-Spont   LIV  2 SAB 1995          1 Term 1991   8 lb 14.4 oz (4.037 kg) F Vag-Spont   LIV    Obstetric Comments  1st Menstrual Cycle:  12  1st Pregnancy: 18    Past Medical History:  Diagnosis Date   Abnormal Pap smear of cervix    Hyperlipidemia    Shortness of breath    Tachycardia     Past Surgical History:  Procedure Laterality Date   BREAST BIOPSY Right 2009   neg   CERVICAL BIOPSY  W/ LOOP ELECTRODE EXCISION     COLONOSCOPY WITH PROPOFOL N/A 09/23/2020   Procedure: COLONOSCOPY WITH PROPOFOL;  Surgeon: 09/25/2020, MD;  Location: [facility] END[facility]COPY;  Service: Endoscopy;  Laterality: N/A;   COLPOSCOPY     DILATION AND CURETTAGE OF UTERUS     KNEE SURGERY  2007    Current Outpatient Medications on File Prior to Visit  Medication Sig Dispense Refill   escitalopram (LEXAPRO) 10 MG tablet Take 5 mg by mouth daily.      JUNEL 1.5/30 1.5-30 MG-MCG tablet TAKE 1 TABLET BY MOUTH EVERY DAY 21 tablet 1   Multiple Vitamins-Minerals (MULTIVITAMIN ADULT PO) Take by mouth.     No current facility-administered medications on file prior to visit.    Allergies  Allergen Reactions   Other Diarrhea    Z-Pak    Social History:  reports that she quit smoking about 32 years ago. Her smoking use included cigarettes. She has a 2.00 pack-year smoking history. She has never used smokeless tobacco. She reports current alcohol use of about 3.0 standard drinks per week. She reports that she does not use drugs.  Family History  Problem Relation Age of Onset   Colon polyps Father 61   Colon cancer Father    Ovarian cancer Paternal Grandmother    Thyroid disease Sister    Breast cancer Neg Hx    Diabetes Neg Hx     The following portions of the patient's history were reviewed and updated as appropriate: allergies, current medications, past family history, past medical history, past social history, past surgical history and problem list.  Review of Systems Pertinent items noted in HPI and remainder of comprehensive ROS otherwise negative.  Physical Exam:  BP 120/84   Pulse 80  Ht 5\' 11"  (1.803 m)   Wt 178 lb 8 oz (81 kg)   LMP  (LMP Unknown)   BMI 24.90 kg/m  CONSTITUTIONAL: Well-developed, well-nourished female in no acute distress.  HENT:  Normocephalic, atraumatic, External right and left ear normal. Oropharynx is clear and moist EYES: Conjunctivae and EOM are normal. Pupils are equal, round, and reactive to light. No scleral icterus.  NECK: Normal range of motion, supple, no masses.  Normal thyroid.  SKIN: Skin is warm and dry. No rash noted. Not diaphoretic. No erythema. No pallor. MUSCULOSKELETAL: Normal range of motion. No tenderness.  No cyanosis, clubbing, or edema.  2+ distal pulses. NEUROLOGIC: Alert and oriented to person, place, and time. Normal reflexes, muscle tone coordination.  PSYCHIATRIC:  Normal mood and affect. Normal behavior. Normal judgment and thought content. CARDIOVASCULAR: Normal heart rate noted, regular rhythm RESPIRATORY: Clear to auscultation bilaterally. Effort and breath sounds normal, no problems with respiration noted. BREASTS: Symmetric in size. No masses, tenderness, skin changes, nipple drainage, or lymphadenopathy bilaterally.  ABDOMEN: Soft, no distention noted.  No tenderness, rebound or guarding.  PELVIC: Normal appearing external genitalia and urethral meatus; normal appearing vaginal mucosa and cervix.  No abnormal discharge noted.  Pap smear obtained.  Normal uterine size, no other palpable masses, no uterine or adnexal tenderness.  .   Assessment and Plan:    1. Well woman exam with routine gynecological exam   Pap:ill follow up results of pap smear and manage accordingly. Mammogram : ordered Labs:  none Refills: ocp Referral: none Routine preventative health maintenance measures emphasized. Please refer to After Visit Summary for other counseling recommendations.      , CNM [facility],  [facility]

## 2021-10-20 LAB — CYTOLOGY - PAP: Diagnosis: NEGATIVE

## 2022-03-11 ENCOUNTER — Encounter: Payer: Self-pay | Admitting: Certified Nurse Midwife

## 2022-03-11 ENCOUNTER — Other Ambulatory Visit: Payer: Self-pay

## 2022-03-11 MED ORDER — ACYCLOVIR 400 MG PO TABS
400.0000 mg | ORAL_TABLET | Freq: Three times a day (TID) | ORAL | 0 refills | Status: AC
Start: 2022-03-11 — End: 2022-03-16

## 2022-03-11 NOTE — Telephone Encounter (Signed)
Rx sent 

## 2022-06-30 ENCOUNTER — Ambulatory Visit
Admission: RE | Admit: 2022-06-30 | Discharge: 2022-06-30 | Disposition: A | Discharge status: Home / Self Care | Payer: BC Managed Care – PPO | Source: Ambulatory Visit | Attending: Certified Nurse Midwife | Admitting: Certified Nurse Midwife

## 2022-06-30 DIAGNOSIS — Z1231 Encounter for screening mammogram for malignant neoplasm of breast: Secondary | ICD-10-CM

## 2022-06-30 DIAGNOSIS — Z01419 Encounter for gynecological examination (general) (routine) without abnormal findings: Secondary | ICD-10-CM

## 2022-07-15 ENCOUNTER — Other Ambulatory Visit: Payer: Self-pay | Admitting: Certified Nurse Midwife

## 2022-10-19 ENCOUNTER — Encounter: Payer: BC Managed Care – PPO | Admitting: Certified Nurse Midwife

## 2022-10-27 ENCOUNTER — Ambulatory Visit
Admission: RE | Admit: 2022-10-27 | Discharge: 2022-10-27 | Disposition: A | Discharge status: Home / Self Care | Payer: BC Managed Care – PPO | Source: Ambulatory Visit | Attending: Student | Admitting: Student

## 2022-10-27 ENCOUNTER — Other Ambulatory Visit: Payer: Self-pay | Admitting: Student

## 2022-10-27 DIAGNOSIS — M79662 Pain in left lower leg: Secondary | ICD-10-CM | POA: Insufficient documentation

## 2022-12-13 ENCOUNTER — Other Ambulatory Visit (HOSPITAL_COMMUNITY)
Admission: RE | Admit: 2022-12-13 | Discharge: 2022-12-13 | Disposition: A | Discharge status: Home / Self Care | Payer: BC Managed Care – PPO | Source: Ambulatory Visit | Attending: Certified Nurse Midwife | Admitting: Certified Nurse Midwife

## 2022-12-13 ENCOUNTER — Ambulatory Visit (INDEPENDENT_AMBULATORY_CARE_PROVIDER_SITE_OTHER): Payer: BC Managed Care – PPO | Admitting: Certified Nurse Midwife

## 2022-12-13 ENCOUNTER — Encounter: Payer: Self-pay | Admitting: Certified Nurse Midwife

## 2022-12-13 VITALS — BP 111/79 | HR 75 | Resp 15 | Ht 71.0 in | Wt 168.0 lb

## 2022-12-13 DIAGNOSIS — Z01419 Encounter for gynecological examination (general) (routine) without abnormal findings: Secondary | ICD-10-CM | POA: Insufficient documentation

## 2022-12-13 DIAGNOSIS — Z124 Encounter for screening for malignant neoplasm of cervix: Secondary | ICD-10-CM

## 2022-12-13 DIAGNOSIS — Z1231 Encounter for screening mammogram for malignant neoplasm of breast: Secondary | ICD-10-CM

## 2022-12-13 MED ORDER — SLYND 4 MG PO TABS: 1.0000 | ORAL_TABLET | Freq: Every day | ORAL | 11 refills | Status: AC

## 2022-12-13 NOTE — Patient Instructions (Signed)
Preventive Care 40-52 Years Old, Female Preventive care refers to lifestyle choices and visits with your health care provider that can promote health and wellness. Preventive care visits are also called wellness exams. What can I expect for my preventive care visit? Counseling Your health care provider may ask you questions about your: Medical history, including: Past medical problems. Family medical history. Pregnancy history. Current health, including: Menstrual cycle. Method of birth control. Emotional well-being. Home life and relationship well-being. Sexual activity and sexual health. Lifestyle, including: Alcohol, nicotine or tobacco, and drug use. Access to firearms. Diet, exercise, and sleep habits. Work and work environment. Sunscreen use. Safety issues such as seatbelt and bike helmet use. Physical exam Your health care provider will check your: Height and weight. These may be used to calculate your BMI (body mass index). BMI is a measurement that tells if you are at a healthy weight. Waist circumference. This measures the distance around your waistline. This measurement also tells if you are at a healthy weight and may help predict your risk of certain diseases, such as type 2 diabetes and high blood pressure. Heart rate and blood pressure. Body temperature. Skin for abnormal spots. What immunizations do I need?  Vaccines are usually given at various ages, according to a schedule. Your health care provider will recommend vaccines for you based on your age, medical history, and lifestyle or other factors, such as travel or where you work. What tests do I need? Screening Your health care provider may recommend screening tests for certain conditions. This may include: Lipid and cholesterol levels. Diabetes screening. This is done by checking your blood sugar (glucose) after you have not eaten for a while (fasting). Pelvic exam and Pap test. Hepatitis B test. Hepatitis C  test. HIV (human immunodeficiency virus) test. STI (sexually transmitted infection) testing, if you are at risk. Lung cancer screening. Colorectal cancer screening. Mammogram. Talk with your health care provider about when you should start having regular mammograms. This may depend on whether you have a family history of breast cancer. BRCA-related cancer screening. This may be done if you have a family history of breast, ovarian, tubal, or peritoneal cancers. Bone density scan. This is done to screen for osteoporosis. Talk with your health care provider about your test results, treatment options, and if necessary, the need for more tests. Follow these instructions at home: Eating and drinking  Eat a diet that includes fresh fruits and vegetables, whole grains, lean protein, and low-fat dairy products. Take vitamin and mineral supplements as recommended by your health care provider. Do not drink alcohol if: Your health care provider tells you not to drink. You are pregnant, may be pregnant, or are planning to become pregnant. If you drink alcohol: Limit how much you have to 0-1 drink a day. Know how much alcohol is in your drink. In the [place] one drink equals one 12 oz bottle of beer (355 mL), one 5 oz glass of wine (148 mL), or one 1 oz glass of hard liquor (44 mL). Lifestyle Brush your teeth every morning and night with fluoride toothpaste. Floss one time each day. Exercise for at least 30 minutes 5 or more days each week. Do not use any products that contain nicotine or tobacco. These products include cigarettes, chewing tobacco, and vaping devices, such as e-cigarettes. If you need help quitting, ask your health care provider. Do not use drugs. If you are sexually active, practice safe sex. Use a condom or other form of protection to   prevent STIs. If you do not wish to become pregnant, use a form of birth control. If you plan to become pregnant, see your health care provider for a  prepregnancy visit. Take aspirin only as told by your health care provider. Make sure that you understand how much to take and what form to take. Work with your health care provider to find out whether it is safe and beneficial for you to take aspirin daily. Find healthy ways to manage stress, such as: Meditation, yoga, or listening to music. Journaling. Talking to a trusted person. Spending time with friends and family. Minimize exposure to UV radiation to reduce your risk of skin cancer. Safety Always wear your seat belt while driving or riding in a vehicle. Do not drive: If you have been drinking alcohol. Do not ride with someone who has been drinking. When you are tired or distracted. While texting. If you have been using any mind-altering substances or drugs. Wear a helmet and other protective equipment during sports activities. If you have firearms in your house, make sure you follow all gun safety procedures. Seek help if you have been physically or sexually abused. What's next? Visit your health care provider once a year for an annual wellness visit. Ask your health care provider how often you should have your eyes and teeth checked. Stay up to date on all vaccines. This information is not intended to replace advice given to you by your health care provider. Make sure you discuss any questions you have with your health care provider. Document Revised: 06/09/2021 Document Reviewed: 06/09/2021 Elsevier Patient Education  [place].

## 2022-12-13 NOTE — Progress Notes (Signed)
GYNECOLOGY ANNUAL PREVENTATIVE CARE ENCOUNTER NOTE  History:     Cheyenne Rodriguez is a 51 y.o. [phone] female here for a routine annual gynecologic exam.  Current complaints: Pt had left DVT and is not on xerelto.   Denies abnormal vaginal bleeding, discharge, pelvic pain, problems with intercourse or other gynecologic concerns.     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   Gynecologic History No LMP recorded (within years). Patient is perimenopausal. Contraception: oral progesterone-only contraceptive Last Pap: 10/21/20. Results were: normal with negative HPV Last mammogram: 06/2022. Results were: normal   The pregnancy intention screening data noted above was reviewed. Potential methods of contraception were discussed. The patient elected to proceed with POP.   Obstetric History OB History  Gravida Para Term Preterm AB Living  3 2 2   1 2   SAB IAB Ectopic Multiple Live Births  1       2    # Outcome Date GA Lbr Len/2nd Weight Sex Delivery Anes PTL Lv  3 Term 1997   9 lb 12.8 oz (4.445 kg) M Vag-Spont   LIV  2 SAB 1995          1 Term 1991   8 lb 14.4 oz (4.037 kg) F Vag-Spont   LIV    Obstetric Comments  1st Menstrual Cycle:  12  1st Pregnancy: 18    Past Medical History:  Diagnosis Date   Abnormal Pap smear of cervix    DVT (deep venous thrombosis) (HCC)    left calf   Hyperlipidemia    Shortness of breath    Tachycardia     Past Surgical History:  Procedure Laterality Date   BREAST BIOPSY Right 2009   neg   CERVICAL BIOPSY  W/ LOOP ELECTRODE EXCISION     COLONOSCOPY WITH PROPOFOL N/A 09/23/2020   Procedure: COLONOSCOPY WITH PROPOFOL;  Surgeon: 09/25/2020, MD;  Location: [facility] ENDOSCOPY;  Service: Endoscopy;  Laterality: N/A;   COLPOSCOPY     DILATION AND CURETTAGE OF UTERUS     KNEE SURGERY  2007    Current  Outpatient Medications on File Prior to Visit  Medication Sig Dispense Refill   Rivaroxaban (XARELTO) 15 MG TABS tablet Take by mouth.     No current facility-administered medications on file prior to visit.    No Known Allergies  Social History:  reports that she quit smoking about 33 years ago. Her smoking use included cigarettes. She has a 2.00 pack-year smoking history. She has never used smokeless tobacco. She reports current alcohol use of about 3.0 standard drinks of alcohol per week. She reports that she does not use drugs.  Family History  Problem Relation Age of Onset   Colon polyps Father 38   Colon cancer Father    Ovarian cancer Paternal Grandmother    Thyroid disease Sister    Breast cancer Neg Hx    Diabetes Neg Hx     The following portions of the patient's history were reviewed and updated as appropriate: allergies, current medications, past family history, past medical history, past social history, past surgical history and problem list.  Review of Systems Pertinent items noted in HPI and remainder of comprehensive ROS otherwise negative.  Physical Exam:  BP 111/79   Pulse 75   Resp 15   Ht 5\' 11"  (1.803 m)  Wt 168 lb (76.2 kg)   LMP  (Within Years)   BMI 23.43 kg/m  CONSTITUTIONAL: Well-developed, well-nourished female in no acute distress.  HENT:  Normocephalic, atraumatic, External right and left ear normal. Oropharynx is clear and moist EYES: Conjunctivae and EOM are normal. Pupils are equal, round, and reactive to light. No scleral icterus.  NECK: Normal range of motion, supple, no masses.  Normal thyroid.  SKIN: Skin is warm and dry. No rash noted. Not diaphoretic. No erythema. No pallor. MUSCULOSKELETAL: Normal range of motion. No tenderness.  No cyanosis, clubbing, or edema.  2+ distal pulses. NEUROLOGIC: Alert and oriented to person, place, and time. Normal reflexes, muscle tone coordination.  PSYCHIATRIC: Normal mood and affect. Normal behavior.  Normal judgment and thought content. CARDIOVASCULAR: Normal heart rate noted, regular rhythm RESPIRATORY: Clear to auscultation bilaterally. Effort and breath sounds normal, no problems with respiration noted. BREASTS: Symmetric in size. No masses, tenderness, skin changes, nipple drainage, or lymphadenopathy bilaterally.  ABDOMEN: Soft, no distention noted.  No tenderness, rebound or guarding.  PELVIC: Normal appearing external genitalia and urethral meatus; normal appearing vaginal mucosa and cervix.  No abnormal discharge noted.  Pap smear obtained.  Contact bleeding . Normal uterine size, no other palpable masses, no uterine or adnexal tenderness.  .   Assessment and Plan:    1. Women's annual routine gynecological examination   Pap: Will follow up results of pap smear and manage accordingly. Mammogram : ordered Labs: declines Refills: POP  Referral: none  Routine preventative health maintenance measures emphasized. Please refer to After Visit Summary for other counseling recommendations.      Doreene Burke, CNM [facility]  [facility],  [facility]

## 2022-12-15 LAB — CYTOLOGY - PAP
Comment: NEGATIVE
Diagnosis: NEGATIVE
High risk HPV: NEGATIVE

## 2023-03-30 ENCOUNTER — Encounter: Payer: Self-pay | Admitting: Certified Nurse Midwife

## 2023-07-17 ENCOUNTER — Ambulatory Visit
Admission: RE | Admit: 2023-07-17 | Discharge: 2023-07-17 | Disposition: A | Discharge status: Home / Self Care | Payer: BC Managed Care – PPO | Source: Ambulatory Visit | Attending: Certified Nurse Midwife | Admitting: Certified Nurse Midwife

## 2023-07-17 DIAGNOSIS — Z01419 Encounter for gynecological examination (general) (routine) without abnormal findings: Secondary | ICD-10-CM | POA: Diagnosis present

## 2023-07-17 DIAGNOSIS — Z1231 Encounter for screening mammogram for malignant neoplasm of breast: Secondary | ICD-10-CM | POA: Insufficient documentation

## 2023-12-22 ENCOUNTER — Other Ambulatory Visit: Payer: Self-pay | Admitting: Family Medicine

## 2023-12-22 DIAGNOSIS — E785 Hyperlipidemia, unspecified: Secondary | ICD-10-CM

## 2023-12-28 ENCOUNTER — Other Ambulatory Visit: Payer: Self-pay

## 2023-12-28 ENCOUNTER — Ambulatory Visit
Admission: RE | Admit: 2023-12-28 | Discharge: 2023-12-28 | Disposition: A | Discharge status: Home / Self Care | Payer: Self-pay | Source: Ambulatory Visit | Attending: Family Medicine | Admitting: Family Medicine

## 2023-12-28 DIAGNOSIS — Z1231 Encounter for screening mammogram for malignant neoplasm of breast: Secondary | ICD-10-CM

## 2023-12-28 DIAGNOSIS — E785 Hyperlipidemia, unspecified: Secondary | ICD-10-CM | POA: Insufficient documentation

## 2024-05-16 ENCOUNTER — Inpatient Hospital Stay: Admission: RE | Admit: 2024-05-16 | Source: Ambulatory Visit

## 2024-05-17 ENCOUNTER — Encounter: Admission: RE | Payer: Self-pay | Source: Home / Self Care

## 2024-05-17 ENCOUNTER — Ambulatory Visit: Admission: RE | Admit: 2024-05-17 | Source: Home / Self Care | Admitting: Obstetrics and Gynecology

## 2024-05-17 SURGERY — DILATATION AND CURETTAGE /HYSTEROSCOPY
Anesthesia: Choice

## 2024-07-10 ENCOUNTER — Other Ambulatory Visit: Payer: Self-pay | Admitting: Medical Genetics

## 2024-07-17 ENCOUNTER — Ambulatory Visit
Admission: RE | Admit: 2024-07-17 | Discharge: 2024-07-17 | Disposition: A | Discharge status: Home / Self Care | Source: Ambulatory Visit

## 2024-07-17 DIAGNOSIS — Z1231 Encounter for screening mammogram for malignant neoplasm of breast: Secondary | ICD-10-CM | POA: Diagnosis present

## 2024-07-18 ENCOUNTER — Other Ambulatory Visit: Payer: Self-pay

## 2024-07-18 DIAGNOSIS — Z006 Encounter for examination for normal comparison and control in clinical research program: Secondary | ICD-10-CM

## 2024-08-03 LAB — GENECONNECT MOLECULAR SCREEN: Genetic Analysis Overall Interpretation: NEGATIVE

## 2024-10-17 ENCOUNTER — Encounter: Payer: Self-pay | Admitting: Internal Medicine

## 2024-10-22 ENCOUNTER — Other Ambulatory Visit: Payer: Self-pay | Admitting: Internal Medicine

## 2024-12-09 ENCOUNTER — Ambulatory Visit: Admitting: Internal Medicine

## 2024-12-09 NOTE — Progress Notes (Deleted)
 Name: Cheyenne Rodriguez   MRN: [id number]    DOB: 1971-03-01   Date:12/09/2024       Progress Note  Subjective  Chief Complaint  No chief complaint on file.   HPI  Patient presents for annual CPE.  Diet: *** Exercise: ***  Last Eye Exam: {HM options:31805} Last Dental Exam: {HM options:31805}  Flowsheet Row Appointment from 12/09/2024 in [facility]  AUDIT-C Score 5    Depression: Phq 9 is  negative    10/18/2021    3:22 PM  Depression screen PHQ 2/9  Decreased Interest 0  Down, Depressed, Hopeless 0  PHQ - 2 Score 0  Altered sleeping 0  Tired, decreased energy 0  Change in appetite 0  Feeling bad or failure about yourself  0  Trouble concentrating 0  Moving slowly or fidgety/restless 0  Suicidal thoughts 0  PHQ-9 Score 0      Data saved with a previous flowsheet row definition   Hypertension: BP Readings from Last 3 Encounters:  12/13/22 111/79  10/18/21 120/84  12/12/20 (!) 144/88   Obesity: Wt Readings from Last 3 Encounters:  12/13/22 168 lb (76.2 kg)  10/18/21 178 lb 8 oz (81 kg)  10/21/20 181 lb 5 oz (82.2 kg)   BMI Readings from Last 3 Encounters:  12/13/22 23.43 kg/m  10/18/21 24.90 kg/m  10/21/20 25.29 kg/m     Vaccines: ***reviewed with the patient.   Hep C Screening: encouraged to complete STD testing and prevention (HIV/chl/gon/syphilis):  Intimate partner violence: negative screen  Sexual History : Menstrual History/LMP/Abnormal Bleeding:  Discussed importance of follow up if any post-menopausal bleeding: yes  Incontinence Symptoms: negative for symptoms   Breast cancer:  - Last Mammogram: 07/17/2024 - BRCA gene screening:   Osteoporosis Prevention : Discussed high calcium and vitamin D supplementation, weight bearing exercises Bone density :not applicable   Cervical cancer screening: up-to-date  Skin cancer: Discussed monitoring for atypical lesions  Colorectal cancer: 09/23/2020   Lung cancer:   Low Dose CT Chest recommended if Age 35-80 years, 20 pack-year currently smoking OR have quit w/in 15years. Patient {DOES NOT does:27190::does not} qualify for screen   ECG: ***  Advanced Care Planning: A voluntary discussion about advance care planning including the explanation and discussion of advance directives.  Discussed health care proxy and Living will, and the patient was able to identify a health care proxy as ***.  Patient {DOES_DOES WNU:81435} have a living will and power of attorney of health care   There are no active problems to display for this patient.   Past Surgical History:  Procedure Laterality Date   BREAST BIOPSY Right 2009   neg   CERVICAL BIOPSY  W/ LOOP ELECTRODE EXCISION     COLONOSCOPY WITH PROPOFOL  N/A 09/23/2020   Procedure: COLONOSCOPY WITH PROPOFOL ;  Surgeon: Dessa Reyes ORN, MD;  Location: [facility] ENDOSCOPY;  Service: Endoscopy;  Laterality: N/A;   COLPOSCOPY     DILATION AND CURETTAGE OF UTERUS     KNEE SURGERY  2007    Family History  Problem Relation Age of Onset   Colon polyps Father 68   Colon cancer Father    Ovarian cancer Paternal Grandmother    Thyroid disease Sister    Breast cancer Neg Hx    Diabetes Neg Hx     Social History   Socioeconomic History   Marital status: Married    Spouse name: Not on file   Number of children: Not on file  Years of education: Not on file   Highest education level: 12th grade  Occupational History   Not on file  Tobacco Use   Smoking status: Former    Current packs/day: 0.00    Average packs/day: 1 pack/day for 2.0 years (2.0 ttl pk-yrs)    Types: Cigarettes    Start date: 61    Quit date: 74    Years since quitting: 35.9   Smokeless tobacco: Never  Vaping Use   Vaping status: Never Used  Substance and Sexual Activity   Alcohol use: Yes    Alcohol/week: 3.0 standard drinks of alcohol    Types: 3 Glasses of wine per week    Comment: weekly   Drug use: No   Sexual activity: Yes     Birth control/protection: Pill  Other Topics Concern   Not on file  Social History Narrative   Not on file   Social Drivers of Health   Tobacco Use: Medium Risk (10/16/2024)   [facility]   Patient History    Smoking Tobacco Use: Former    Smokeless Tobacco Use: Never    Passive Exposure: Not on file  Financial Resource Strain: Low Risk (12/07/2024)   Overall Financial Resource Strain (CARDIA)    Difficulty of Paying Living Expenses: Not hard at all  Food Insecurity: No Food Insecurity (12/07/2024)   Epic    Worried About Radiation Protection Practitioner of Food in the Last Year: Never true    Ran Out of Food in the Last Year: Never true  Transportation Needs: No Transportation Needs (12/07/2024)   Epic    Lack of Transportation (Medical): No    Lack of Transportation (Non-Medical): No  Physical Activity: Insufficiently Active (12/07/2024)   Exercise Vital Sign    Days of Exercise per Week: 5 days    Minutes of Exercise per Session: 20 min  Stress: No Stress Concern Present (12/07/2024)   [employer] of Occupational Health - Occupational Stress Questionnaire    Feeling of Stress: Not at all  Social Connections: Socially Integrated (12/07/2024)   Social Connection and Isolation Panel    Frequency of Communication with Friends and Family: More than three times a week    Frequency of Social Gatherings with Friends and Family: Twice a week    Attends Religious Services: More than 4 times per year    Active Member of [employer] or Organizations: Yes    Attends Banker Meetings: More than 4 times per year    Marital Status: Married  Catering Manager Violence: Not on file  Depression (PHQ2-9): Not on file  Alcohol Screen: Low Risk (12/07/2024)   Alcohol Screen    Last Alcohol Screening Score (AUDIT): 6  Housing: Unknown (12/07/2024)   Epic    Unable to Pay for Housing in the Last Year: No    Number of Times Moved in the Last Year: Not on file     Homeless in the Last Year: No  Utilities: Not At Risk (12/28/2023)   Received from [facility]    Threatened with loss of utilities: No  Health Literacy: Not on file    Current Medications[1]  Allergies[2]   ROS  ***  Objective  There were no vitals filed for this visit.  There is no height or weight on file to calculate BMI.  Physical Exam ***  {Show previous labs (optional):23779}  Assessment & Plan  There are no diagnoses linked to this  encounter.  -USPSTF grade A and B recommendations reviewed with patient; age-appropriate recommendations, preventive care, screening tests, etc discussed and encouraged; healthy living encouraged; see AVS for patient education given to patient -Discussed importance of 150 minutes of physical activity weekly, eat two servings of fish weekly, eat one serving of tree nuts ( cashews, pistachios, pecans, almonds.SABRA) every other day, eat 6 servings of fruit/vegetables daily and drink plenty of water and avoid sweet beverages.   -Reviewed Health Maintenance: {yes no:314532}     [1]  Current Outpatient Medications:    Drospirenone  (SLYND ) 4 MG TABS, Take 1 tablet by mouth daily., Disp: 28 tablet, Rfl: 11   Rivaroxaban (XARELTO) 15 MG TABS tablet, Take by mouth., Disp: , Rfl:  [2] No Known Allergies

## 2025-02-06 ENCOUNTER — Ambulatory Visit: Admitting: Internal Medicine
